# Patient Record
Sex: Female | Born: 1955
Health system: Southern US, Community
[De-identification: ages and names within clinical notes are randomized; demographics above are authoritative.]

## PROBLEM LIST (undated history)

## (undated) DIAGNOSIS — N83209 Unspecified ovarian cyst, unspecified side: Secondary | ICD-10-CM

## (undated) DIAGNOSIS — Z86718 Personal history of other venous thrombosis and embolism: Secondary | ICD-10-CM

## (undated) DIAGNOSIS — N200 Calculus of kidney: Secondary | ICD-10-CM

## (undated) DIAGNOSIS — M25569 Pain in unspecified knee: Secondary | ICD-10-CM

## (undated) DIAGNOSIS — E785 Hyperlipidemia, unspecified: Secondary | ICD-10-CM

## (undated) DIAGNOSIS — Z87442 Personal history of urinary calculi: Secondary | ICD-10-CM

## (undated) DIAGNOSIS — M1990 Unspecified osteoarthritis, unspecified site: Secondary | ICD-10-CM

## (undated) DIAGNOSIS — I2699 Other pulmonary embolism without acute cor pulmonale: Secondary | ICD-10-CM

## (undated) DIAGNOSIS — R112 Nausea with vomiting, unspecified: Secondary | ICD-10-CM

## (undated) DIAGNOSIS — N6091 Unspecified benign mammary dysplasia of right breast: Secondary | ICD-10-CM

## (undated) DIAGNOSIS — K635 Polyp of colon: Secondary | ICD-10-CM

## (undated) DIAGNOSIS — Z9889 Other specified postprocedural states: Secondary | ICD-10-CM

## (undated) DIAGNOSIS — J3089 Other allergic rhinitis: Secondary | ICD-10-CM

## (undated) HISTORY — PX: KIDNEY STONE SURGERY: SHX686

## (undated) HISTORY — DX: Hyperlipidemia, unspecified: E78.5

## (undated) HISTORY — DX: Unspecified benign mammary dysplasia of right breast: N60.91

## (undated) HISTORY — DX: Polyp of colon: K63.5

## (undated) HISTORY — PX: TUBAL LIGATION: SHX77

## (undated) HISTORY — DX: Personal history of other venous thrombosis and embolism: Z86.718

## (undated) HISTORY — PX: EYE SURGERY: SHX253

## (undated) HISTORY — DX: Unspecified ovarian cyst, unspecified side: N83.209

## (undated) HISTORY — DX: Calculus of kidney: N20.0

## (undated) HISTORY — PX: FOOT SURGERY: SHX648

---

## 1992-05-29 HISTORY — PX: LASIK: SHX215

## 1993-05-29 HISTORY — PX: REFRACTIVE SURGERY: SHX103

## 1996-11-26 DIAGNOSIS — Z86718 Personal history of other venous thrombosis and embolism: Secondary | ICD-10-CM

## 1996-11-26 HISTORY — DX: Personal history of other venous thrombosis and embolism: Z86.718

## 1997-05-29 HISTORY — PX: ABDOMINAL HYSTERECTOMY: SHX81

## 2003-02-27 HISTORY — PX: TOTAL ABDOMINAL HYSTERECTOMY W/ BILATERAL SALPINGOOPHORECTOMY: SHX83

## 2012-04-28 HISTORY — PX: COLONOSCOPY: SHX174

## 2012-09-26 DIAGNOSIS — N200 Calculus of kidney: Secondary | ICD-10-CM

## 2012-09-26 HISTORY — DX: Calculus of kidney: N20.0

## 2012-09-26 HISTORY — PX: LITHOTRIPSY: SUR834

## 2012-11-12 ENCOUNTER — Ambulatory Visit: Payer: Self-pay | Admitting: Obstetrics and Gynecology

## 2012-12-03 ENCOUNTER — Ambulatory Visit (INDEPENDENT_AMBULATORY_CARE_PROVIDER_SITE_OTHER): Payer: 59 | Admitting: General Surgery

## 2012-12-03 ENCOUNTER — Encounter: Payer: Self-pay | Admitting: General Surgery

## 2012-12-03 VITALS — BP 132/70 | HR 72 | Resp 12 | Ht 68.0 in | Wt 175.0 lb

## 2012-12-03 DIAGNOSIS — N63 Unspecified lump in unspecified breast: Secondary | ICD-10-CM

## 2012-12-03 NOTE — Patient Instructions (Addendum)
CARE AFTER BREAST BIOPSY  1. Leave the dressing on that your doctor applied after surgery. It is waterproof. You may bathe, shower and/or swim. The dressing will probably remain intact until your return office visit. If the dressing comes off, you will see small strips of tape against your skin on the incision. Do not remove these strips.  2. You may want to use a gauze,cloth or similar protection in your bra to prevent rubbing against your dressing and incision. This is not necessary, but you may feel more comfortable doing so.  3. It is recommended that you wear a bra day and night to give support to the breast. This will prevent the weight of the breast from pulling on the incision.  4. Your breast will feel hard and lumpy under the incision. Do not be alarmed. This is the underlying stitching of tissue. Softening of this tissue will occur in time.  5. Make sure you call the office and schedule an appointment in one week after your surgery. The office phone number is [phone]. The [facility] may have already done this for you.  6. You will notice about a week after your office visit that the strips of the tape on your incision will begin to loosen. These may then be removed.  7. Report to your doctor any of the following:  * Severe pain not relieved by your pain medication  *Redness of the incision  * Drainage from the incision  *Fever greater than 101 degrees  If biopsy normal then follow up in 6 months

## 2012-12-03 NOTE — Progress Notes (Signed)
Patient ID: Virginia Fritz, female   DOB: 1955/12/28, 57 y.o.   MRN: [id number]  Chief Complaint  Patient presents with  . Follow-up    mammogram    HPI Virginia Fritz is a 57 y.o. female here for a follow up mammogram that was abnormall. Screening mammogram done at [facility], then had additional views done at [facility] June 17. The patient does perform routine breast exams and does get mammograms done regularly. Patient does have a family history of breast cancer, sister breast cancer age 41. Patient unable to feel "anything", no breast trauma. HPI  Past Medical History  Diagnosis Date  . Hyperlipidemia   . Hx of blood clots July 1998    PE  . Kidney calculi May 2014    Past Surgical History  Procedure Laterality Date  . Abdominal hysterectomy  05/1997  . Total abdominal hysterectomy w/ bilateral salpingoophorectomy  02/2003  . Cesarean section  1989, 1993  . Lasik  1994  . Refractive surgery  1995  . Foot surgery  1995  . Lithotripsy  May 2014  . Colonoscopy  Dec 2013    Virginia Fritz    Family History  Problem Relation Age of Onset  . Breast cancer Sister   . Diabetes Father     Social History History  Substance Use Topics  . Smoking status: Never Smoker   . Smokeless tobacco: Never Used  . Alcohol Use: Yes    Allergies  Allergen Reactions  . Amoxil (Amoxicillin) Hives  . Ivp Dye (Iodinated Diagnostic Agents)   . Percocet (Oxycodone-Acetaminophen) Nausea And Vomiting    Current Outpatient Prescriptions  Medication Sig Dispense Refill  . aspirin 81 MG tablet Take 81 mg by mouth daily.      Marland Kitchen conjugated estrogens (PREMARIN) vaginal cream Place 1 g vaginally once a week.       No current facility-administered medications for this visit.    Review of Systems Review of Systems  Constitutional: Negative.   Respiratory: Negative.   Cardiovascular: Negative.     Blood pressure 132/70, pulse 72, resp. rate 12, height 5\' 8"  (1.727 m), weight 175 lb (79.379  kg).  Physical Exam Physical Exam  Constitutional: She is oriented to person, place, and time. She appears well-developed and well-nourished.  Cardiovascular: Normal rate and regular rhythm.   Pulmonary/Chest: Effort normal and breath sounds normal. Right breast exhibits no inverted nipple, no mass, no nipple discharge, no skin change and no tenderness. Left breast exhibits no inverted nipple, no mass, no nipple discharge, no skin change and no tenderness.  Lymphadenopathy:    She has no cervical adenopathy.    She has no axillary adenopathy.  Neurological: She is alert and oriented to person, place, and time.  Skin: Skin is warm and dry.    Data Reviewed Screening mammogram dated October 30, 2012 showed scattered fibroglandular density and an increasing nodule the lateral aspect of the right breast. BI-RAD-0. Focal spot compression views and ultrasound dated November 12, 2012 showed persistent nodular density. Ultrasound showed a 0.9 cm irregular hypoechoic nodule without vascularity corresponded to the mammographic abnormality. BI-RAD-4.  Ultrasound examination confirmed a 0.5 x 0.82 x 0.85 cm hypoechoic slightly irregular mass with no clear-cut acoustic enhancement or shadowing at the 9:00 position of the right breast 5 cm from the nipple. The patient was amenable to biopsy.  10 cc of 0.5% Xylocaine with 0.25% Marcaine with one 200,000 units of epinephrine was utilized a well-tolerated. ChloraPrep was applied to  the skin. A 10-gauge Encor device was used and the area in question completely removed. A postbiopsy clip was placed. Skin defect was closed with benzoin and Steri-Strips followed by Telfa and Tegaderm dressing. The procedure was well tolerated.    Assessment    Right breast mass     Plan    The patient will be contacted when the pathology is available. She will follow up with the nurse in one week for a wound check. Assuming benign results a followup mammogram will be obtained in 6  months.        Virginia Fritz 12/04/2012, 7:35 PM

## 2012-12-04 ENCOUNTER — Encounter: Payer: Self-pay | Admitting: General Surgery

## 2012-12-04 DIAGNOSIS — N63 Unspecified lump in unspecified breast: Secondary | ICD-10-CM | POA: Insufficient documentation

## 2012-12-05 LAB — PATHOLOGY

## 2012-12-06 ENCOUNTER — Telehealth: Payer: Self-pay | Admitting: General Surgery

## 2012-12-06 NOTE — Telephone Encounter (Signed)
The patient was notified of the results from her 12/03/2012 vacuum biopsy of the right breast. A single focus of atypical ductal hyperplasia was identified and a large volume of tissue. Review of the previous mammogram and ultrasound was completed. Postbiopsy imaging showed the area completely removed.  The patient was notified that standard recommendation is for wide excision after identification of 80 H. because with 12% incidence of a more significant process. Based on complete removal of the area, a single foci on pathologic review I think that the likelihood of finding DCIS or utilize likely invasive cancers exceptionally small.  I would be very comfortable with obtaining a 6 month followup mammogram and going from that point.  At this time the patient is comfortable with six-month followup. She was advised that should she have concerns were desirous it down discussion prior to January 2015 she should call.  She will follow up next week with a nurse as previously scheduled.

## 2012-12-11 ENCOUNTER — Ambulatory Visit (INDEPENDENT_AMBULATORY_CARE_PROVIDER_SITE_OTHER): Payer: 59 | Admitting: *Deleted

## 2012-12-11 DIAGNOSIS — N63 Unspecified lump in unspecified breast: Secondary | ICD-10-CM

## 2012-12-11 NOTE — Progress Notes (Signed)
Patient here today for follow up post right breast biopsy.  Minimal bruising noted.  The patient is aware that a heating pad may be used for comfort as needed.Patient had alittle redness from the tegaderm on the outer area of the incision.Patient is aware of her path and will follow up as scheduled.

## 2012-12-20 ENCOUNTER — Encounter: Payer: Self-pay | Admitting: General Surgery

## 2013-04-03 ENCOUNTER — Other Ambulatory Visit: Payer: Self-pay

## 2013-06-17 ENCOUNTER — Ambulatory Visit: Payer: Self-pay | Admitting: General Surgery

## 2013-06-18 ENCOUNTER — Encounter: Payer: Self-pay | Admitting: General Surgery

## 2013-06-25 ENCOUNTER — Encounter: Payer: Self-pay | Admitting: General Surgery

## 2013-06-25 ENCOUNTER — Other Ambulatory Visit: Payer: Self-pay | Admitting: General Surgery

## 2013-06-25 ENCOUNTER — Ambulatory Visit (INDEPENDENT_AMBULATORY_CARE_PROVIDER_SITE_OTHER): Payer: 59 | Admitting: General Surgery

## 2013-06-25 ENCOUNTER — Other Ambulatory Visit: Payer: 59

## 2013-06-25 VITALS — BP 130/72 | HR 74 | Resp 12 | Ht 68.0 in | Wt 180.0 lb

## 2013-06-25 DIAGNOSIS — N6089 Other benign mammary dysplasias of unspecified breast: Secondary | ICD-10-CM

## 2013-06-25 DIAGNOSIS — N63 Unspecified lump in unspecified breast: Secondary | ICD-10-CM

## 2013-06-25 DIAGNOSIS — N6091 Unspecified benign mammary dysplasia of right breast: Secondary | ICD-10-CM

## 2013-06-25 NOTE — Patient Instructions (Addendum)
Patient to have an left breast biopsy at [facility] Breast Biopsy A breast biopsy is a procedure where a sample of breast tissue is removed from your breast. The tissue is examined under a microscope to see if cancerous cells are present. A breast biopsy is done when there is:  Any undiagnosed breast mass (tumor).  Nipple abnormalities, dimpling, crusting, or ulcerations.  Abnormal discharge from the nipple, especially blood.  Redness, swelling, and pain of the breast.  Calcium deposits (calcifications) or abnormalities seen on a mammogram, ultrasound result, or results of magnetic resonance imaging (MRI).  Suspicious changes in the breast seen on your mammogram. If the tumor is found to be cancerous (malignant), a breast biopsy can help to determine what the best treatment is for you. There are many different types of breast biopsies. Talk to your caregiver about your options and which type is best for you. LET YOUR CAREGIVER KNOW ABOUT:  Allergies to food or medicine.  Medicines taken, including vitamins, herbs, eyedrops, over-the-counter medicines, and creams.  Use of steroids (by mouth or creams).  Previous problems with anesthetics or numbing medicines.  History of bleeding problems or blood clots.  Previous surgery.  Other health problems, including diabetes and kidney problems.  Any recent colds or infections.  Possibility of pregnancy, if this applies. RISKS AND COMPLICATIONS   Bleeding.  Infection.  Allergy to medicines.  Bruising and swelling of the breast.  Alteration in the shape of the breast.  Not finding the lump or abnormality.  Needing more surgery. BEFORE THE PROCEDURE  Arrange for someone to drive you home after the procedure.  Do not smoke for 2 weeks before the procedure. Stop smoking, if you smoke.  Do not drink alcohol for 24 hours before procedure.  Wear a good support bra to the procedure. PROCEDURE  You may be given a medicine to numb  the breast area (local anesthesia) or a medicine to make you sleep (general anesthesia) during the procedure. The following are the different types of biopsies that can be performed.   Fine-needle aspiration A thin needle is attached to a syringe and inserted into the breast lump. Fluid and cells are removed and then looked at under a microscope. If the breast lump cannot be felt, an ultrasound may be used to help locate the lump and place the needle in the correct area.   Core needle biopsy A wide, hollow needle (core needle) is inserted into the breast lump 3 6 times to get tissue samples or cores. The samples are removed. The needle is usually placed in the correct area by using an ultrasound or X-ray.   Stereotactic biopsy X-ray equipment and a computer are used to analyze X-ray pictures of the breast lump. The computer then finds exactly where the core needle needs to be inserted. Tissue samples are removed.   Vacuum-assisted biopsy A small incision (less than  inch) is made in your breast. A biopsy device that includes a hollow needle and vacuum is passed through the incision and into the breast tissue. The vacuum gently draws abnormal breast tissue into the needle to remove it. This type of biopsy removes a larger tissue sample than a regular core needle biopsy. No stitches are needed, and there is usually little scarring.  Ultrasound-guided core needle biopsy A high frequency ultrasound helps guide the core needle to the area of the mass or abnormality. An incision is made to insert the needle. Tissue samples are removed.  Open biopsy A larger incision  is made in the breast. Your caregiver will attempt to remove the whole breast lump or as much as possible. AFTER THE PROCEDURE  You will be taken to the recovery area. If you are doing well and have no problems, you will be allowed to go home.  You may notice bruising on your breast. This is normal.  Your caregiver may apply a pressure  dressing on your breast for 24 48 hours. A pressure dressing is a bandage that is wrapped tightly around the chest to stop fluid from collecting underneath tissues. Document Released: 05/15/2005 Document Revised: 09/09/2012 Document Reviewed: 06/15/2011 [facility] Patient Information 2014 [place].  Patient's surgery has been scheduled for 07-02-13 at [facility].

## 2013-06-25 NOTE — Progress Notes (Signed)
Patient ID: Virginia Fritz, female   DOB: [birth year], 57 y.o.   MRN: [id number]  Chief Complaint  Patient presents with  . Follow-up    6 month right diagnostic mammogram     HPI Virginia Fritz is a 58 y.o. female who presents for a breast evaluation. The most recent mammogram was done on 06/17/13. Patient does perform regular self breast checks and gets regular mammograms done.  The patient denies any problems with the breasts at this time.    HPI  Past Medical History  Diagnosis Date  . Hyperlipidemia   . Hx of blood clots July 1998    PE  . Kidney calculi May 2014    Past Surgical History  Procedure Laterality Date  . Abdominal hysterectomy  05/1997  . Total abdominal hysterectomy w/ bilateral salpingoophorectomy  02/2003  . Cesarean section  1989, 1993  . Lasik  1994  . Refractive surgery  1995  . Foot surgery  1995  . Lithotripsy  May 2014  . Colonoscopy  Dec 2013    [name]    Family History  Problem Relation Age of Onset  . Breast cancer Sister   . Diabetes Father     Social History History  Substance Use Topics  . Smoking status: Never Smoker   . Smokeless tobacco: Never Used  . Alcohol Use: Yes    Allergies  Allergen Reactions  . Amoxil [Amoxicillin] Hives  . Ivp Dye [Iodinated Diagnostic Agents]   . Percocet [Oxycodone-Acetaminophen] Nausea And Vomiting    Current Outpatient Prescriptions  Medication Sig Dispense Refill  . aspirin 81 MG tablet Take 81 mg by mouth daily.      Marland Kitchen conjugated estrogens (PREMARIN) vaginal cream Place 1 g vaginally once a week.      . desoximetasone (TOPICORT) 0.05 % cream Apply 1 application topically once a week.       No current facility-administered medications for this visit.    Review of Systems Review of Systems  Constitutional: Negative.   Respiratory: Negative.   Cardiovascular: Negative.     Blood pressure 130/72, pulse 74, resp. rate 12, height 5\' 8"  (1.727 m), weight 180 lb (81.647  kg).  Physical Exam Physical Exam  Constitutional: She is oriented to person, place, and time. She appears well-developed and well-nourished.  Neck: Neck supple.  Cardiovascular: Normal rate, regular rhythm and normal heart sounds.   Pulmonary/Chest: Effort normal and breath sounds normal. Right breast exhibits no inverted nipple, no mass, no nipple discharge, no skin change and no tenderness. Left breast exhibits no inverted nipple, no mass, no nipple discharge, no skin change and no tenderness.  Lymphadenopathy:    She has no cervical adenopathy.    She has no axillary adenopathy.  Neurological: She is alert and oriented to person, place, and time.  Skin: Skin is warm.    Data Reviewed Right breast mammogram and ultrasound dated June 17, 2013 was reviewed. Previously placed biopsy clip is evident. Ultrasound examination showed an irregular hypoechoic mass in the right breast at 9:00 position 4 cm from the nipple. A biopsy clip is noted to be centrally located within the lesion. BI-RAD-4.   Ultrasound was completed today to confirm that the site of the previous biopsy could be identified in the operating room. A hypoechoic 1.1 cm area as described by the radiologist Is identified in the right breast at the 9:00 position.  Assessment    Atypical ductal hyperplasia the right breast.     Plan  The presently identified ultrasound findings are likely secondary to the vacuum biopsy completed 6 months ago. At the time of her original biopsy we discussed the 12% incidence of findings or significant ADH, but a planned 6 month followup was to be undertaken. With the recent ultrasound findings and with her husband's encouragement, she is interested in proceeding to excisional biopsy. The procedure was reviewed, and pros and cons of operative biopsy were discussed.    Patient's surgery has been scheduled for 07-02-13 at [facility].   Robert Bellow 06/25/2013, 8:52 PM

## 2013-06-30 DIAGNOSIS — N6091 Unspecified benign mammary dysplasia of right breast: Secondary | ICD-10-CM

## 2013-06-30 HISTORY — DX: Unspecified benign mammary dysplasia of right breast: N60.91

## 2013-07-02 ENCOUNTER — Ambulatory Visit: Payer: Self-pay | Admitting: General Surgery

## 2013-07-02 DIAGNOSIS — N6089 Other benign mammary dysplasias of unspecified breast: Secondary | ICD-10-CM

## 2013-07-02 HISTORY — PX: BREAST SURGERY: SHX581

## 2013-07-03 ENCOUNTER — Encounter: Payer: Self-pay | Admitting: General Surgery

## 2013-07-04 LAB — PATHOLOGY REPORT

## 2013-07-06 ENCOUNTER — Encounter: Payer: Self-pay | Admitting: General Surgery

## 2013-07-15 ENCOUNTER — Ambulatory Visit (INDEPENDENT_AMBULATORY_CARE_PROVIDER_SITE_OTHER): Payer: 59 | Admitting: General Surgery

## 2013-07-15 ENCOUNTER — Encounter: Payer: Self-pay | Admitting: General Surgery

## 2013-07-15 VITALS — BP 110/70 | HR 76 | Resp 14 | Ht 68.0 in | Wt 177.0 lb

## 2013-07-15 DIAGNOSIS — N6089 Other benign mammary dysplasias of unspecified breast: Secondary | ICD-10-CM

## 2013-07-15 DIAGNOSIS — N6091 Unspecified benign mammary dysplasia of right breast: Secondary | ICD-10-CM

## 2013-07-15 NOTE — Progress Notes (Signed)
Patient ID: Virginia Fritz, female   DOB: [birth year], 58 y.o.   MRN: [id number]  Chief Complaint  Patient presents with  . Routine Post Op    right breast biopsy    HPI Virginia Fritz is a 58 y.o. female here today for her post op right breast biopsy done on 07/02/13. Patient states she has a rash near overlying the right breast and chest wall developed 1 week after surgery. This has decreased in intensity since its initial discovery with the use of cream and Benadryl.  The patient is still appreciating some mild heaviness in the breast when it is dependent for    .HPI    Past Medical History  Diagnosis Date  . Hyperlipidemia   . Hx of blood clots July 1998    PE  . Kidney calculi May 2014    Past Surgical History  Procedure Laterality Date  . Abdominal hysterectomy  05/1997  . Total abdominal hysterectomy w/ bilateral salpingoophorectomy  02/2003  . Cesarean section  1989, 1993  . Lasik  1994  . Refractive surgery  1995  . Foot surgery  1995  . Lithotripsy  May 2014  . Colonoscopy  Dec 2013    Virginia Fritz    Family History  Problem Relation Age of Onset  . Breast cancer Sister   . Diabetes Father     Social History History  Substance Use Topics  . Smoking status: Never Smoker   . Smokeless tobacco: Never Used  . Alcohol Use: Yes    Allergies  Allergen Reactions  . Amoxil [Amoxicillin] Hives  . Ivp Dye [Iodinated Diagnostic Agents]   . Percocet [Oxycodone-Acetaminophen] Nausea And Vomiting    Current Outpatient Prescriptions  Medication Sig Dispense Refill  . aspirin 81 MG tablet Take 81 mg by mouth daily.      Marland Kitchen conjugated estrogens (PREMARIN) vaginal cream Place 1 g vaginally once a week.      . desoximetasone (TOPICORT) 0.05 % cream Apply 1 application topically once a week.       No current facility-administered medications for this visit.    Review of Systems Review of Systems  Constitutional: Negative.   Respiratory: Negative.    Cardiovascular: Negative.     Blood pressure 110/70, pulse 76, resp. rate 14, height 5\' 8"  (1.727 m), weight 177 lb (80.287 kg).  Physical Exam Physical Exam  Constitutional: She is oriented to person, place, and time. She appears well-developed and well-nourished.  Eyes: Conjunctivae are normal.  Neck: Neck supple.  Pulmonary/Chest:  Right breast thickening along the scar.   Neurological: She is alert and oriented to person, place, and time.  Skin: Skin is warm and dry.    Data Reviewed 1 mm foci of atypical ductal hyperplasia.  Assessment    Doing well status post reexcision of ADH.    Plan    The patient's case was presented at the [facility] tumor board. No additional therapy was felt indicated.  The patient had declined chemoprevention in the past. In light of her pulmonary embolus in 1990s without clear etiology, this seems prudent.  We'll plan for bilateral mammograms in July 2015 with an office visit to follow.       Virginia Fritz 07/15/2013, 12:06 PM

## 2013-07-15 NOTE — Patient Instructions (Signed)
Patient to return in 5 months bilateral diagnotic mammogram.

## 2013-12-19 ENCOUNTER — Encounter: Payer: Self-pay | Admitting: General Surgery

## 2013-12-25 ENCOUNTER — Ambulatory Visit: Payer: 59 | Admitting: General Surgery

## 2013-12-29 ENCOUNTER — Encounter: Payer: Self-pay | Admitting: General Surgery

## 2013-12-29 ENCOUNTER — Ambulatory Visit (INDEPENDENT_AMBULATORY_CARE_PROVIDER_SITE_OTHER): Payer: 59 | Admitting: General Surgery

## 2013-12-29 VITALS — BP 120/70 | HR 74 | Resp 12 | Ht 68.0 in | Wt 174.0 lb

## 2013-12-29 DIAGNOSIS — N6091 Unspecified benign mammary dysplasia of right breast: Secondary | ICD-10-CM

## 2013-12-29 DIAGNOSIS — N6089 Other benign mammary dysplasias of unspecified breast: Secondary | ICD-10-CM

## 2013-12-29 NOTE — Progress Notes (Signed)
Patient ID: Virginia Fritz, female   DOB: 01-09-1956, 58 y.o.   MRN: [id number]  Chief Complaint  Patient presents with  . Follow-up    mammogram    HPI Virginia Fritz is a 58 y.o. female who presents for a breast evaluation. The most recent mammogram was done on 12/19/13 at [facility].Marland Kitchen  Patient does perform regular self breast checks and gets regular mammograms done.    HPI  Past Medical History  Diagnosis Date  . Hyperlipidemia   . Hx of blood clots July 1998    PE post-operative  . Kidney calculi May 2014  . Atypical ductal hyperplasia of right breast February 2,2015    1 mm foci of ADH without margin involvement    Past Surgical History  Procedure Laterality Date  . Abdominal hysterectomy  05/1997  . Total abdominal hysterectomy w/ bilateral salpingoophorectomy  02/2003  . Cesarean section  1989, 1993  . Lasik  1994  . Refractive surgery  1995  . Foot surgery  1995  . Lithotripsy  May 2014  . Colonoscopy  Dec 2013    Virginia Fritz  . Breast surgery Right 07/02/2013    Foci of ADH on core biopsy, no upstaging on formal excision.    Family History  Problem Relation Age of Onset  . Breast cancer Sister   . Diabetes Father     Social History History  Substance Use Topics  . Smoking status: Never Smoker   . Smokeless tobacco: Never Used  . Alcohol Use: Yes    Allergies  Allergen Reactions  . Amoxil [Amoxicillin] Hives  . Ivp Dye [Iodinated Diagnostic Agents]   . Percocet [Oxycodone-Acetaminophen] Nausea And Vomiting    Current Outpatient Prescriptions  Medication Sig Dispense Refill  . aspirin 81 MG tablet Take 81 mg by mouth daily.      Marland Kitchen conjugated estrogens (PREMARIN) vaginal cream Place 1 g vaginally once a week.      . desoximetasone (TOPICORT) 0.05 % cream Apply 1 application topically once a week.      . Multiple Vitamin (MULTI-VITAMINS) TABS Take 1 tablet by mouth daily.       No current facility-administered medications for this visit.    Review of  Systems Review of Systems  Constitutional: Negative.   Respiratory: Negative.   Cardiovascular: Negative.     Blood pressure 120/70, pulse 74, resp. rate 12, height 5\' 8"  (1.727 m), weight 174 lb (78.926 kg).  Physical Exam Physical Exam  Constitutional: She is oriented to person, place, and time. She appears well-developed and well-nourished.  Eyes: Conjunctivae are normal. No scleral icterus.  Neck: Neck supple.  Cardiovascular: Normal rate, regular rhythm and normal heart sounds.   Pulmonary/Chest: Effort normal and breath sounds normal. Right breast exhibits no inverted nipple, no mass, no nipple discharge, no skin change and no tenderness. Left breast exhibits no inverted nipple, no mass, no nipple discharge, no skin change and no tenderness.  Right breast incision well healed.    Abdominal: Soft. Bowel sounds are normal. There is no tenderness.  Lymphadenopathy:    She has no cervical adenopathy.    She has no axillary adenopathy.  Neurological: She is alert and oriented to person, place, and time.  Skin: Skin is warm and dry.    Data Reviewed Bilateral mammograms dated 12/19/2013 completed at [facility] were reviewed and compared to pre-procedure studies. Postbiopsy changes are noted in the right breast. BI-RAD-2.  Assessment    Atypical ductal hyperplasia, past history pulmonary  embolus.    Plan    Anticipated 50% reduction in breast malignancy is overwhelmed by the increase thrombotic affect of tamoxifen. For that reason observation has been chosen.  We'll make arrangements for bilateral screening mammograms in one year.    PCP: [name], [name] 12/30/2013, 3:03 PM

## 2013-12-29 NOTE — Patient Instructions (Signed)
Patient to return in one year bilateral diagnotic mammogram.

## 2013-12-30 ENCOUNTER — Encounter: Payer: Self-pay | Admitting: General Surgery

## 2014-03-30 ENCOUNTER — Encounter: Payer: Self-pay | Admitting: General Surgery

## 2014-04-15 ENCOUNTER — Ambulatory Visit: Payer: Self-pay | Admitting: Podiatry

## 2014-09-19 NOTE — Op Note (Signed)
PATIENT NAME:  Virginia Fritz, Virginia Fritz MR#:  [id number] DATE OF BIRTH:  1955/11/16  DATE OF PROCEDURE:  07/02/2013  PREOPERATIVE DIAGNOSIS: Abnormal mammogram/ultrasound.   POSTOPERATIVE DIAGNOSIS: Abnormal mammogram/ultrasound.   OPERATIVE PROCEDURE: Wide local excision of right breast.   SURGEON: Robert Bellow, MD   ANESTHESIA: General by LMA under Dr. Myra Gianotti, Marcaine 0.5% with 1:200,000 units epinephrine, 30 mL local infiltration.   ESTIMATED BLOOD LOSS: Minimal.   CLINICAL NOTE: This 59 year old woman had previously undergone an ultrasound-guided biopsy of a nodular density in the breast. There was a microscopic foci of ADH, and planned observation was undertaken. Her most recent mammogram showed a prominent area in the area of the previous biopsy, and re-excision was felt appropriate.   OPERATIVE NOTE: With the patient under adequate general anesthesia, the breast was prepped with ChloraPrep and draped. Marcaine was infiltrated for postoperative analgesia. An ultrasound was used to identify the previous biopsy cavity. A radial incision was made at the 9 o'clock position. In the office, the area appeared to be about 4 cm from the nipple, but in the operating room, the area was approximately 7 cm from the nipple. The skin was incised sharply and hemostasis achieved with electrocautery and 3-0 Vicryl ties. Beginning at approximately 1.5 cm below the skin, a 5 x 5 x 6 cm block of tissue was excised and orientated. Specimen radiograph showed the previously placed clip within the center of the specimen, confirmed by the attending pathologist, Bryan Lemma, MD. The deep adipose tissue was approximated with interrupted 2-0 Vicryl figure-of-eight sutures. A similar suture was used for the superficial adipose tissue. Marcaine was infiltrated in the cavity for postoperative analgesia. Benzoin and Steri-Strips followed by fluff gauze, Kerlix, and an Ace wrap was applied.   The patient tolerated the procedure  well and was taken to the recovery room in stable condition.   ____________________________ Robert Bellow, MD jwb:jcm D: 07/02/2013 18:01:54 ET T: 07/02/2013 20:03:39 ET JOB#: [id number]  cc: Robert Bellow, MD, <Dictator> Marcie Bal [name], MD Kanon Novosel Amedeo Kinsman MD ELECTRONICALLY SIGNED 07/09/2013 10:30

## 2014-09-21 LAB — SURGICAL PATHOLOGY

## 2014-12-18 ENCOUNTER — Encounter: Payer: Self-pay | Admitting: General Surgery

## 2014-12-28 ENCOUNTER — Encounter: Payer: Self-pay | Admitting: General Surgery

## 2014-12-28 ENCOUNTER — Ambulatory Visit (INDEPENDENT_AMBULATORY_CARE_PROVIDER_SITE_OTHER): Payer: 59 | Admitting: General Surgery

## 2014-12-28 VITALS — BP 124/70 | HR 74 | Resp 12 | Ht 68.0 in | Wt 181.0 lb

## 2014-12-28 DIAGNOSIS — Z1239 Encounter for other screening for malignant neoplasm of breast: Secondary | ICD-10-CM

## 2014-12-28 DIAGNOSIS — N62 Hypertrophy of breast: Secondary | ICD-10-CM | POA: Diagnosis not present

## 2014-12-28 DIAGNOSIS — N6091 Unspecified benign mammary dysplasia of right breast: Secondary | ICD-10-CM

## 2014-12-28 NOTE — Patient Instructions (Signed)
Patient will be asked to return to the office in one year with a bilateral screening mammogram. 

## 2014-12-28 NOTE — Progress Notes (Signed)
Patient ID: Virginia Fritz, female   DOB: 11-25-1955, 60 y.o.   MRN: [id number]  Chief Complaint  Patient presents with  . Follow-up    mammogram    HPI Virginia Fritz is a 59 y.o. female who presents for a breast evaluation. The most recent mammogram was done on 12/17/14. Patient does perform regular self breast checks and gets regular mammograms done.     HPI  Past Medical History  Diagnosis Date  . Hyperlipidemia   . Hx of blood clots July 1998    PE post-operative  . Kidney calculi May 2014  . Atypical ductal hyperplasia of right breast February 2,2015    1 mm foci of ADH without margin involvement    Past Surgical History  Procedure Laterality Date  . Abdominal hysterectomy  05/1997  . Total abdominal hysterectomy w/ bilateral salpingoophorectomy  02/2003  . Cesarean section  1989, 1993  . Lasik  1994  . Refractive surgery  1995  . Foot surgery  [id number]  . Lithotripsy  May 2014  . Colonoscopy  Dec 2013    Virginia Fritz  . Breast surgery Right 07/02/2013    Foci of ADH on core biopsy, no upstaging on formal excision.    Family History  Problem Relation Age of Onset  . Breast cancer Sister   . Diabetes Father     Social History History  Substance Use Topics  . Smoking status: Never Smoker   . Smokeless tobacco: Never Used  . Alcohol Use: Yes    Allergies  Allergen Reactions  . Amoxil [Amoxicillin] Hives  . Ivp Dye [Iodinated Diagnostic Agents]   . Percocet [Oxycodone-Acetaminophen] Nausea And Vomiting    Current Outpatient Prescriptions  Medication Sig Dispense Refill  . aspirin 81 MG tablet Take 81 mg by mouth daily.    Marland Kitchen conjugated estrogens (PREMARIN) vaginal cream Place 1 g vaginally once a week.    . desoximetasone (TOPICORT) 0.05 % cream Apply 1 application topically once a week.    . Multiple Vitamin (MULTI-VITAMINS) TABS Take 1 tablet by mouth daily.     No current facility-administered medications for this visit.    Review of  Systems Review of Systems  Blood pressure 124/70, pulse 74, resp. rate 12, height 5\' 8"  (1.727 m), weight 181 lb (82.101 kg).  Physical Exam Physical Exam  Constitutional: She is oriented to person, place, and time. She appears well-developed and well-nourished.  Eyes: Conjunctivae are normal. No scleral icterus.  Neck: Neck supple.  Cardiovascular: Normal rate, regular rhythm and normal heart sounds.   Pulmonary/Chest: Effort normal and breath sounds normal. Right breast exhibits no inverted nipple, no mass, no nipple discharge, no skin change and no tenderness. Left breast exhibits no inverted nipple, no mass, no nipple discharge, no skin change and no tenderness.    Right breast scar well healed at 9 o'clock  Lymphadenopathy:    She has no cervical adenopathy.    She has no axillary adenopathy.  Neurological: She is alert and oriented to person, place, and time.  Skin: Skin is warm and dry.    Data Reviewed Bilateral screening mammograms dated 12/17/2014 showed postsurgical changes. No other abnormalities. BI-RADS-2.  Assessment    Stable breast exam. No evidence of mammographic abnormality.    Plan    The patient will be asked to return in one year for final follow-up. Her case was presented at the [facility] tumor board in 2015 and adjuvant therapy/chemoprevention was not felt indicated. Considering her past  history of pulmonary embolus was felt to be very reasonable.     Patient will be asked to return to the office in one year with a bilateral screening mammogram. PCP: Copland, Marily Lente 12/28/2014, 9:51 PM

## 2015-12-28 ENCOUNTER — Encounter: Payer: Self-pay | Admitting: General Surgery

## 2016-01-13 ENCOUNTER — Encounter: Payer: Self-pay | Admitting: General Surgery

## 2016-01-13 ENCOUNTER — Ambulatory Visit (INDEPENDENT_AMBULATORY_CARE_PROVIDER_SITE_OTHER): Payer: 59 | Admitting: General Surgery

## 2016-01-13 VITALS — BP 110/60 | HR 76 | Resp 12 | Ht 68.0 in | Wt 179.0 lb

## 2016-01-13 DIAGNOSIS — N6091 Unspecified benign mammary dysplasia of right breast: Secondary | ICD-10-CM

## 2016-01-13 DIAGNOSIS — N62 Hypertrophy of breast: Secondary | ICD-10-CM

## 2016-01-13 NOTE — Progress Notes (Addendum)
Patient ID: Virginia Fritz, female   DOB: 17-Jun-1955, 60 y.o.   MRN: [id number]  Chief Complaint  Patient presents with  . Follow-up    mammogram    HPI Virginia Fritz is a 60 y.o. female.  who presents for a breast evaluation. The most recent mammogram was done on 12-28-15.  Patient does perform regular self breast checks and gets regular mammograms done.     HPI  Past Medical History:  Diagnosis Date  . Atypical ductal hyperplasia of right breast February 2,2015   1 mm foci of ADH without margin involvement  . Hx of blood clots 11/1996   PE post-operative  . Hyperlipidemia   . Kidney calculi May 2014    Past Surgical History:  Procedure Laterality Date  . ABDOMINAL HYSTERECTOMY  05/1997  . BREAST SURGERY Right 07/02/2013   Foci of ADH on core biopsy, no upstaging on formal excision.  Marland [place]  . COLONOSCOPY  Dec 2013   [name]  . FOOT SURGERY  [phone]  . LASIK  1994  . LITHOTRIPSY  May 2014  . [place]  . TOTAL ABDOMINAL HYSTERECTOMY W/ BILATERAL SALPINGOOPHORECTOMY  02/2003  . TUBAL LIGATION      Family History  Problem Relation Age of Onset  . Diabetes Father   . Breast cancer Sister   . Breast cancer Paternal Aunt     Social History Social History  Substance Use Topics  . Smoking status: Never Smoker  . Smokeless tobacco: Never Used  . Alcohol use Yes    Allergies  Allergen Reactions  . Amoxil [Amoxicillin] Hives  . Ivp Dye [Iodinated Diagnostic Agents]   . Percocet [Oxycodone-Acetaminophen] Nausea And Vomiting    Current Outpatient Prescriptions  Medication Sig Dispense Refill  . aspirin 81 MG tablet Take 81 mg by mouth daily.    Marland Kitchen conjugated estrogens (PREMARIN) vaginal cream Place 1 g vaginally once a week.    . desoximetasone (TOPICORT) 0.05 % cream Apply 1 application topically once a week.    . Multiple Vitamin (MULTI-VITAMINS) TABS Take 1 tablet by mouth daily.     No current  facility-administered medications for this visit.     Review of Systems Review of Systems  Constitutional: Negative.   Respiratory: Negative.   Cardiovascular: Negative.     Blood pressure 110/60, pulse 76, resp. rate 12, height 5\' 8"  (1.727 m), weight 179 lb (81.2 kg).  Physical Exam Physical Exam  Constitutional: She is oriented to person, place, and time. She appears well-developed and well-nourished.  Eyes: Conjunctivae are normal. No scleral icterus.  Neck: Neck supple.  Cardiovascular: Normal rate and regular rhythm.   Pulmonary/Chest: Effort normal and breath sounds normal. Right breast exhibits no inverted nipple, no mass, no nipple discharge, no skin change and no tenderness. Left breast exhibits no inverted nipple, no mass, no nipple discharge, no skin change and no tenderness.    Abdominal: Soft. Bowel sounds are normal. There is no tenderness.  Lymphadenopathy:    She has no cervical adenopathy.    She has no axillary adenopathy.  Neurological: She is alert and oriented to person, place, and time.  Skin: Skin is warm and dry.    Data Reviewed December 28, 2015 [facility] BI mammograms reviewed: No interval change. BIRAD 1.   Assessment    Benign breast exam and mammograms.   Chemoprevention previously determined not appropriate with her history of PE in 1990's (likely related to BCP started  3 weeks before event).     Plan    Annual screening mammograms with her PCP/ GYN.     Patient to return as needed.  This information has been scribed by Karie Fetch RN, BSN,BC.  Robert Bellow 01/14/2016, 8:53 PM

## 2016-01-13 NOTE — Patient Instructions (Addendum)
The patient is aware to call back for any questions or concerns. Patient to return as needed. 

## 2016-09-28 ENCOUNTER — Ambulatory Visit
Admission: EM | Admit: 2016-09-28 | Discharge: 2016-09-28 | Disposition: A | Discharge status: Home / Self Care | Payer: 59 | Attending: Family Medicine | Admitting: Family Medicine

## 2016-09-28 DIAGNOSIS — J019 Acute sinusitis, unspecified: Secondary | ICD-10-CM

## 2016-09-28 DIAGNOSIS — J029 Acute pharyngitis, unspecified: Secondary | ICD-10-CM | POA: Diagnosis not present

## 2016-09-28 LAB — RAPID STREP SCREEN (MED CTR MEBANE ONLY): STREPTOCOCCUS, GROUP A SCREEN (DIRECT): NEGATIVE

## 2016-09-28 MED ORDER — CEFUROXIME AXETIL 500 MG PO TABS: 500.0000 mg | ORAL_TABLET | Freq: Two times a day (BID) | ORAL | 0 refills | Status: DC

## 2016-09-28 MED ORDER — FEXOFENADINE-PSEUDOEPHED ER 180-240 MG PO TB24: 1.0000 | ORAL_TABLET | Freq: Every day | ORAL | 0 refills | Status: DC

## 2016-09-28 MED ORDER — BENZONATATE 200 MG PO CAPS: 200.0000 mg | ORAL_CAPSULE | Freq: Three times a day (TID) | ORAL | 0 refills | Status: DC | PRN

## 2016-09-28 NOTE — ED Triage Notes (Signed)
Pt c/o cough for about a week with a runny nose and now a sore throat.

## 2016-09-28 NOTE — ED Provider Notes (Signed)
[facility]    CSN: [id number] Arrival date & time: 09/28/16  0849     History   Chief Complaint Chief Complaint  Patient presents with  . Cough    HPI Virginia Fritz is a 61 y.o. female.   Patient is a 61 year old white female history of congestion and sinus pressure and sinus pain. She states everything started about 8 days ago last Thursday. Getting significantly worse. She had a sore throat for a few days that seems be getting somewhat better now but now she is coughing up yellowish-green phlegm as well as the sinus drainage and pressure behind her sinuses. She is allergic to penicillin. Past medical history she had phlebitis 20 years ago and was taking blood thinners until she had her abdominal hysterectomy she takes an aspirin a day now along with hysterectomy she's had breast surgery C-sections colonoscopy foot surgery laser surgery on her eyes and tubal ligation before she had the hysterectomy. Father had diabetes sister and maternal family member with breast cancer. She does not smoke and she is allergic to Percocet and amoxicillin and IV dye   The history is provided by the patient. No language interpreter was used.  Cough  Cough characteristics:  Productive Sputum characteristics:  Yellow and green Severity:  Moderate Onset quality:  Sudden Timing:  Constant Progression:  Worsening Chronicity:  New Smoker: no   Context: animal exposure, sick contacts and upper respiratory infection   Context: not occupational exposure   Relieved by:  Cough suppressants Worsened by:  Activity Ineffective treatments:  Rest and cough suppressants (anti histamines) Associated symptoms: sinus congestion and sore throat     Past Medical History:  Diagnosis Date  . Atypical ductal hyperplasia of right breast February 2,2015   1 mm foci of ADH without margin involvement  . Hx of blood clots 11/1996   PE post-operative  . Hyperlipidemia   . Kidney calculi May 2014     Patient Active Problem List   Diagnosis Date Noted  . Atypical ductal hyperplasia of right breast 06/25/2013    Past Surgical History:  Procedure Laterality Date  . ABDOMINAL HYSTERECTOMY  05/1997  . BREAST SURGERY Right 07/02/2013   Foci of ADH on core biopsy, no upstaging on formal excision.  Marland Kitchen [place]  . COLONOSCOPY  Dec 2013   [name]  . FOOT SURGERY  [phone]  . LASIK  1994  . LITHOTRIPSY  May 2014  . [place]  . TOTAL ABDOMINAL HYSTERECTOMY W/ BILATERAL SALPINGOOPHORECTOMY  02/2003  . TUBAL LIGATION      OB History    Gravida Para Term Preterm AB Living   2 2       2    SAB TAB Ectopic Multiple Live Births                  Obstetric Comments   Menstrual age: 89  Age 1st Pregnancy: 65       Home Medications    Prior to Admission medications   Medication Sig Start Date End Date Taking? Authorizing Provider  aspirin 81 MG tablet Take 81 mg by mouth daily.   Yes Historical Provider, MD  conjugated estrogens (PREMARIN) vaginal cream Place 1 g vaginally once a week.   Yes Historical Provider, MD  desoximetasone (TOPICORT) 0.05 % cream Apply 1 application topically once a week. 04/16/13  Yes Historical Provider, MD  Multiple Vitamin (MULTI-VITAMINS) TABS Take 1 tablet by mouth daily.  Yes Historical Provider, MD  benzonatate (TESSALON) 200 MG capsule Take 1 capsule (200 mg total) by mouth 3 (three) times daily as needed. 09/28/16   Frederich Cha, MD  cefUROXime (CEFTIN) 500 MG tablet Take 1 tablet (500 mg total) by mouth 2 (two) times daily. 09/28/16   Frederich Cha, MD  fexofenadine-pseudoephedrine (ALLEGRA-D ALLERGY & CONGESTION) 180-240 MG 24 hr tablet Take 1 tablet by mouth daily. 09/28/16   Frederich Cha, MD    Family History Family History  Problem Relation Age of Onset  . Diabetes Father   . Breast cancer Sister   . Breast cancer Paternal Aunt     Social History Social History  Substance Use Topics  . Smoking status:  Never Smoker  . Smokeless tobacco: Never Used  . Alcohol use Yes     Allergies   Amoxil [amoxicillin]; Ivp dye [iodinated diagnostic agents]; and Percocet [oxycodone-acetaminophen]   Review of Systems Review of Systems  HENT: Positive for sore throat.   Respiratory: Positive for cough.   All other systems reviewed and are negative.    Physical Exam Triage Vital Signs ED Triage Vitals  Enc Vitals Group     BP 09/28/16 0910 125/71     Pulse Rate 09/28/16 0910 95     Resp 09/28/16 0910 18     Temp 09/28/16 0910 98.3 F (36.8 C)     Temp Source 09/28/16 0910 Oral     SpO2 09/28/16 0910 99 %     Weight 09/28/16 0910 170 lb (77.1 kg)     Height 09/28/16 0910 5\' 8"  (1.727 m)     Head Circumference --      Peak Flow --      Pain Score 09/28/16 0911 3     Pain Loc --      Pain Edu? --      Excl. in [place]? --    No data found.   Updated Vital Signs BP 125/71 (BP Location: Left Arm)   Pulse 95   Temp 98.3 F (36.8 C) (Oral)   Resp 18   Ht 5\' 8"  (1.727 m)   Wt 170 lb (77.1 kg)   SpO2 99%   BMI 25.85 kg/m   Visual Acuity Right Eye Distance:   Left Eye Distance:   Bilateral Distance:    Right Eye Near:   Left Eye Near:    Bilateral Near:     Physical Exam  Constitutional: She is oriented to person, place, and time. Vital signs are normal. She appears well-developed and well-nourished.  HENT:  Head: Normocephalic and atraumatic.  Right Ear: Hearing, tympanic membrane, external ear and ear canal normal.  Left Ear: Hearing, tympanic membrane, external ear and ear canal normal.  Nose: Mucosal edema and rhinorrhea present. Right sinus exhibits no maxillary sinus tenderness and no frontal sinus tenderness. Left sinus exhibits no maxillary sinus tenderness and no frontal sinus tenderness.  Mouth/Throat: Uvula is midline. No oral lesions. No dental abscesses, uvula swelling or dental caries. Posterior oropharyngeal erythema present.  Eyes: Conjunctivae are normal. Pupils  are equal, round, and reactive to light.  Neck: Normal range of motion. Neck supple.  Cardiovascular: Normal rate, regular rhythm and normal heart sounds.   Pulmonary/Chest: Effort normal.  Musculoskeletal: Normal range of motion.  Lymphadenopathy:    She has cervical adenopathy.  Neurological: She is alert and oriented to person, place, and time.  Skin: Skin is warm.  Psychiatric: She has a normal mood and affect.  Vitals reviewed.  UC Treatments / Results  Labs (all labs ordered are listed, but only abnormal results are displayed) Labs Reviewed  RAPID STREP SCREEN (NOT AT [facility])  CULTURE, GROUP A STREP [facility])    EKG  EKG Interpretation None       Radiology No results found.  Procedures Procedures (including critical care time)  Medications Ordered in UC Medications - No data to display  Results for orders placed or performed during the hospital encounter of 09/28/16  Rapid strep screen  Result Value Ref Range   Streptococcus, Group A Screen (Direct) NEGATIVE NEGATIVE   Initial Impression / Assessment and Plan / UC Course  I have reviewed the triage vital signs and the nursing notes.  Pertinent labs & imaging results that were available during my care of the patient were reviewed by me and considered in my medical decision making (see chart for details).     We'll treat patient for sinusitis with Ceftin 500 mg twice a day and Tessalon Perles for cough and Allegra-D 24 hours 1 tablet daily. Follow-up PCP if not better in 2 weeks. She declined work note  Final Clinical Impressions(s) / UC Diagnoses   Final diagnoses:  Acute sinusitis, recurrence not specified, unspecified location  Pharyngitis, unspecified etiology    New Prescriptions Discharge Medication List as of 09/28/2016 10:02 AM    START taking these medications   Details  benzonatate (TESSALON) 200 MG capsule Take 1 capsule (200 mg total) by mouth 3 (three) times daily as needed., Starting Thu  09/28/2016, Normal    cefUROXime (CEFTIN) 500 MG tablet Take 1 tablet (500 mg total) by mouth 2 (two) times daily., Starting Thu 09/28/2016, Normal    fexofenadine-pseudoephedrine (ALLEGRA-D ALLERGY & CONGESTION) 180-240 MG 24 hr tablet Take 1 tablet by mouth daily., Starting Thu 09/28/2016, Normal         Frederich Cha, MD 09/28/16 1040

## 2016-10-01 LAB — CULTURE, GROUP A STREP (THRC)

## 2016-12-29 ENCOUNTER — Encounter: Payer: Self-pay | Admitting: Obstetrics and Gynecology

## 2017-01-08 ENCOUNTER — Encounter: Payer: Self-pay | Admitting: Obstetrics and Gynecology

## 2017-02-27 ENCOUNTER — Encounter: Payer: Self-pay | Admitting: Obstetrics and Gynecology

## 2017-02-27 ENCOUNTER — Ambulatory Visit (INDEPENDENT_AMBULATORY_CARE_PROVIDER_SITE_OTHER): Payer: 59 | Admitting: Obstetrics and Gynecology

## 2017-02-27 VITALS — BP 130/80 | HR 79 | Ht 68.0 in | Wt 178.0 lb

## 2017-02-27 DIAGNOSIS — Z0000 Encounter for general adult medical examination without abnormal findings: Secondary | ICD-10-CM | POA: Diagnosis not present

## 2017-02-27 DIAGNOSIS — Z13220 Encounter for screening for lipoid disorders: Secondary | ICD-10-CM | POA: Diagnosis not present

## 2017-02-27 DIAGNOSIS — Z124 Encounter for screening for malignant neoplasm of cervix: Secondary | ICD-10-CM

## 2017-02-27 DIAGNOSIS — Z1151 Encounter for screening for human papillomavirus (HPV): Secondary | ICD-10-CM | POA: Diagnosis not present

## 2017-02-27 DIAGNOSIS — Z01419 Encounter for gynecological examination (general) (routine) without abnormal findings: Secondary | ICD-10-CM

## 2017-02-27 DIAGNOSIS — Z803 Family history of malignant neoplasm of breast: Secondary | ICD-10-CM

## 2017-02-27 DIAGNOSIS — N951 Menopausal and female climacteric states: Secondary | ICD-10-CM | POA: Diagnosis not present

## 2017-02-27 DIAGNOSIS — Z1239 Encounter for other screening for malignant neoplasm of breast: Secondary | ICD-10-CM

## 2017-02-27 DIAGNOSIS — Z1231 Encounter for screening mammogram for malignant neoplasm of breast: Secondary | ICD-10-CM

## 2017-02-27 MED ORDER — ESTRADIOL 0.1 MG/GM VA CREA: TOPICAL_CREAM | VAGINAL | 1 refills | Status: DC

## 2017-02-27 NOTE — Progress Notes (Signed)
PCP: Chad Cordial, PA-C   Chief Complaint  Patient presents with  . Gynecologic Exam    HPI:      Ms. ILLYRIA Fritz is a 60 y.o. G2P2 who LMP was No LMP recorded. Patient has had a hysterectomy., presents today for her annual examination.  Her menses are absent due to hyst. S/P TAH 1999 due to DUB, s/p BSO a few yrs later due to ovar cysts. She does not have intermenstrual bleeding.  She does have vasomotor sx. She is s/p PE on OCPs so can't have ERT.  Sex activity: single partner, contraception - post menopausal status. She does have vaginal dryness. She uses estrace crm with some relief. Plans to try coconut oil for lubricant as well.   Last Pap: April 29, 2014  Results were: no abnormalities /neg HPV DNA.  Hx of STDs: none  Last mammogram: December 29, 2016  Results were: normal--routine follow-up in 12 months There is a FH of breast cancer in her sister and pat aunt, genetic testing not done and declined by pt in past. There is no FH of ovarian cancer. The patient does do self-breast exams. She has a hx of atypical ductal hyperplasia RT breast 2015, followed by Dr. Bary Castilla for awhile.  Colonoscopy: colonoscopy 5 years ago without abnormalities. . Repeat due after 10 years.    Tobacco use: The patient denies current or previous tobacco use. Alcohol use: none Exercise: moderately active  She does get adequate calcium and Vitamin D in her diet.  Borderline lipids and Vit D deficiency 9/17.    Past Medical History:  Diagnosis Date  . Atypical ductal hyperplasia of right breast February 2,2015   1 mm foci of ADH without margin involvement  . Colon polyps   . Hx of blood clots 11/1996   PE post-operative on OCPs  . Hyperlipidemia   . Hyperlipidemia   . Kidney calculi May 2014  . Ovarian cyst     Past Surgical History:  Procedure Laterality Date  . ABDOMINAL HYSTERECTOMY  05/1997  . BREAST SURGERY Right 07/02/2013   Foci of ADH on core biopsy, no upstaging on  formal excision.  [facility]  . COLONOSCOPY  Dec 2013   [name]  . FOOT SURGERY  [phone]  . LASIK  1994  . LITHOTRIPSY  May 2014  . [employer]  . TOTAL ABDOMINAL HYSTERECTOMY W/ BILATERAL SALPINGOOPHORECTOMY  02/2003  . TUBAL LIGATION      Family History  Problem Relation Age of Onset  . Diabetes Father   . Hypertension Father   . Hyperlipidemia Father   . Breast cancer Sister 18  . Melanoma Sister   . Breast cancer Paternal Aunt 57    Social History   Social History  . Marital status: Married    Spouse name: N/A  . Number of children: N/A  . Years of education: N/A   Occupational History  . Not on file.   Social History Main Topics  . Smoking status: Never Smoker  . Smokeless tobacco: Never Used  . Alcohol use Yes  . Drug use: No  . Sexual activity: Yes   Other Topics Concern  . Not on file   Social History Narrative  . No narrative on file    No outpatient prescriptions have been marked as taking for the 02/27/17 encounter (Office Visit) with Kandy Towery, Deirdre Evener, PA-C.      ROS:  Review of Systems  Constitutional:  Negative for fatigue, fever and unexpected weight change.  Respiratory: Negative for cough, shortness of breath and wheezing.   Cardiovascular: Negative for chest pain, palpitations and leg swelling.  Gastrointestinal: Positive for constipation. Negative for blood in stool, diarrhea, nausea and vomiting.  Endocrine: Negative for cold intolerance, heat intolerance and polyuria.  Genitourinary: Positive for frequency. Negative for dyspareunia, dysuria, flank pain, genital sores, hematuria, menstrual problem, pelvic pain, urgency, vaginal bleeding, vaginal discharge and vaginal pain.  Musculoskeletal: Negative for back pain, joint swelling and myalgias.  Skin: Negative for rash.  Neurological: Negative for dizziness, syncope, light-headedness, numbness and headaches.  Hematological: Negative for adenopathy.    Psychiatric/Behavioral: Negative for agitation, confusion, sleep disturbance and suicidal ideas. The patient is not nervous/anxious.      Objective: BP 130/80   Pulse 79   Ht 5\' 8"  (1.727 m)   Wt 178 lb (80.7 kg)   BMI 27.06 kg/m    Physical Exam  Constitutional: She is oriented to person, place, and time. She appears well-developed and well-nourished.  Genitourinary: Vagina normal. There is no rash or tenderness on the right labia. There is no rash or tenderness on the left labia. No erythema or tenderness in the vagina. No vaginal discharge found. Right adnexum does not display mass and does not display tenderness. Left adnexum does not display mass and does not display tenderness.  Genitourinary Comments: UTERUS/CX SURG REM  Neck: Normal range of motion. No thyromegaly present.  Cardiovascular: Normal rate, regular rhythm and normal heart sounds.   No murmur heard. Pulmonary/Chest: Effort normal and breath sounds normal. Right breast exhibits no mass, no nipple discharge, no skin change and no tenderness. Left breast exhibits no mass, no nipple discharge, no skin change and no tenderness.  Abdominal: Soft. There is no tenderness. There is no guarding.  Musculoskeletal: Normal range of motion.  Neurological: She is alert and oriented to person, place, and time. No cranial nerve deficit.  Psychiatric: She has a normal mood and affect. Her behavior is normal.  Vitals reviewed.   Assessment/Plan:  Encounter for annual routine gynecological examination  Cervical cancer screening - Plan: IGP, Aptima HPV  Screening for HPV (human papillomavirus) - Plan: IGP, Aptima HPV  Screening for breast cancer - Pt is current on mammo. - Plan: MM DIGITAL SCREENING BILATERAL  Family history of breast cancer - MyRisk testing discussed and pt declines. F/u prn.   Blood tests for routine general physical examination - Plan: Comprehensive metabolic panel, Lipid panel, Comprehensive metabolic  panel, Lipid panel  Screening cholesterol level - Borderline lipids last yr but pt walking 4 times wkly now with wt loss from last yr. - Plan: Lipid panel, Lipid panel  Vaginal dryness, menopausal - Rx RF estrace crm. - Plan: estradiol (ESTRACE) 0.1 MG/GM vaginal cream   Meds ordered this encounter  Medications  . estradiol (ESTRACE) 0.1 MG/GM vaginal cream    Sig: Insert 1g once weekly as maintenace    Dispense:  42.5 g    Refill:  1           GYN counsel breast self exam, mammography screening, menopause, adequate intake of calcium and vitamin D, diet and exercise    F/U  Return in about 1 year (around 02/27/2018).  Chisom Aust B. Rivaldo Hineman, PA-C 02/27/2017 4:19 PM

## 2017-03-01 LAB — COMPREHENSIVE METABOLIC PANEL
A/G RATIO: 1.9 (ref 1.2–2.2)
ALBUMIN: 4.7 g/dL (ref 3.5–5.5)
ALT: 64 IU/L — ABNORMAL HIGH (ref 0–32)
AST: 56 IU/L — ABNORMAL HIGH (ref 0–40)
Alkaline Phosphatase: 81 IU/L (ref 39–117)
BUN / CREAT RATIO: 21 (ref 9–23)
BUN: 16 mg/dL (ref 6–24)
Bilirubin Total: 0.4 mg/dL (ref 0.0–1.2)
CALCIUM: 9.6 mg/dL (ref 8.7–10.2)
CO2: 24 mmol/L (ref 20–29)
CREATININE: 0.77 mg/dL (ref 0.57–1.00)
Chloride: 107 mmol/L — ABNORMAL HIGH (ref 96–106)
GFR, EST AFRICAN AMERICAN: 98 mL/min/{1.73_m2} (ref >59)
GFR, EST NON AFRICAN AMERICAN: 85 mL/min/{1.73_m2} (ref >59)
GLOBULIN, TOTAL: 2.5 g/dL (ref 1.5–4.5)
Glucose: 99 mg/dL (ref 65–99)
Potassium: 4.9 mmol/L (ref 3.5–5.2)
SODIUM: 144 mmol/L (ref 134–144)
Total Protein: 7.2 g/dL (ref 6.0–8.5)

## 2017-03-01 LAB — LIPID PANEL W/O CHOL/HDL RATIO
Cholesterol, Total: 215 mg/dL — ABNORMAL HIGH (ref 100–199)
HDL: 45 mg/dL (ref >39)
LDL CALC: 131 mg/dL — AB (ref 0–99)
Triglycerides: 197 mg/dL — ABNORMAL HIGH (ref 0–149)
VLDL Cholesterol Cal: 39 mg/dL (ref 5–40)

## 2017-03-02 LAB — IGP, APTIMA HPV
HPV APTIMA: NEGATIVE
PAP SMEAR COMMENT: 0

## 2018-01-07 ENCOUNTER — Encounter: Payer: Self-pay | Admitting: Obstetrics and Gynecology

## 2018-10-23 ENCOUNTER — Emergency Department
Admission: EM | Admit: 2018-10-23 | Discharge: 2018-10-23 | Disposition: A | Discharge status: Home / Self Care | Payer: Commercial Managed Care - PPO | Attending: Emergency Medicine | Admitting: Emergency Medicine

## 2018-10-23 ENCOUNTER — Emergency Department: Payer: Commercial Managed Care - PPO

## 2018-10-23 ENCOUNTER — Other Ambulatory Visit: Payer: Self-pay

## 2018-10-23 DIAGNOSIS — M7122 Synovial cyst of popliteal space [Baker], left knee: Secondary | ICD-10-CM | POA: Diagnosis not present

## 2018-10-23 DIAGNOSIS — M79605 Pain in left leg: Secondary | ICD-10-CM | POA: Diagnosis present

## 2018-10-23 DIAGNOSIS — R200 Anesthesia of skin: Secondary | ICD-10-CM | POA: Insufficient documentation

## 2018-10-23 DIAGNOSIS — Z7982 Long term (current) use of aspirin: Secondary | ICD-10-CM | POA: Insufficient documentation

## 2018-10-23 DIAGNOSIS — Z79899 Other long term (current) drug therapy: Secondary | ICD-10-CM | POA: Insufficient documentation

## 2018-10-23 HISTORY — DX: Other pulmonary embolism without acute cor pulmonale: I26.99

## 2018-10-23 NOTE — ED Notes (Signed)
Patient reports an episode of numbness behind her left knee.  Patient states she is still feeling some numbness but also tingling at this time.  Patient has movement and sensation intact to left foot.  Patient reports history of blood clots.

## 2018-10-23 NOTE — Discharge Instructions (Signed)
Please use Advil or Tylenol as needed for mild pain.  Please return for increasing pain numbness or swelling in the lower leg.  Please follow-up with Dr. Jefm Bryant or Dr. Youlanda Mighty for the Baker's cyst.  Their office a call in the morning me should be out of see you fairly quickly.

## 2018-10-23 NOTE — ED Provider Notes (Signed)
[facility] Emergency Department Provider Note   ____________________________________________   First MD Initiated Contact with Patient 10/23/18 1815     (approximate)  I have reviewed the triage vital signs and the nursing notes.   HISTORY  Chief Complaint Leg Pain   HPI Virginia Fritz is a 63 y.o. female who reports her knee gave out on her about 2 weeks ago.  She saw Dr. Jefm Bryant for her.  X-rays were negative at that time.  Last night she had a painful lump come up behind the left knee.  Today her leg got a little bit numb as well.  This was below the knee.  Symptoms are now better just the lump is painful.  Moderately painful.         Past Medical History:  Diagnosis Date  . Atypical ductal hyperplasia of right breast February 2,2015   1 mm foci of ADH without margin involvement  . Colon polyps   . Hx of blood clots 11/1996   PE post-operative on OCPs  . Hyperlipidemia   . Hyperlipidemia   . Kidney calculi May 2014  . Ovarian cyst   . PE (pulmonary thromboembolism) [facility]) [id number]    Patient Active Problem List   Diagnosis Date Noted  . Atypical ductal hyperplasia of right breast 06/25/2013    Past Surgical History:  Procedure Laterality Date  . ABDOMINAL HYSTERECTOMY  05/1997  . BREAST SURGERY Right 07/02/2013   Foci of ADH on core biopsy, no upstaging on formal excision.  Marland [facility] [employer]  . COLONOSCOPY  Dec 2013   [name]  . FOOT SURGERY  [phone]  . LASIK  1994  . LITHOTRIPSY  May 2014  . [facility]  . TOTAL ABDOMINAL HYSTERECTOMY W/ BILATERAL SALPINGOOPHORECTOMY  02/2003  . TUBAL LIGATION      Prior to Admission medications   Medication Sig Start Date End Date Taking? Authorizing Provider  aspirin 81 MG tablet Take 81 mg by mouth daily.    [provider]  benzonatate (TESSALON) 200 MG capsule Take 1 capsule (200 mg total) by mouth 3 (three) times daily as needed. 09/28/16   Frederich Cha, MD  cefUROXime (CEFTIN) 500 MG tablet Take 1 tablet (500 mg total) by mouth 2 (two) times daily. 09/28/16   Frederich Cha, MD  desoximetasone (TOPICORT) 0.05 % cream Apply 1 application topically once a week. 04/16/13   [provider]  estradiol (ESTRACE) 0.1 MG/GM vaginal cream Insert 1g once weekly as maintenace 03/07/31   Copland, Alicia B, PA-C  fexofenadine-pseudoephedrine (ALLEGRA-D ALLERGY & CONGESTION) 180-240 MG 24 hr tablet Take 1 tablet by mouth daily. 09/28/16   Frederich Cha, MD  Multiple Vitamin (MULTI-VITAMINS) TABS Take 1 tablet by mouth daily.    [provider]    Allergies Amoxil [amoxicillin]; Ivp dye [iodinated diagnostic agents]; and Percocet [oxycodone-acetaminophen]  Family History  Problem Relation Age of Onset  . Diabetes Father   . Hypertension Father   . Hyperlipidemia Father   . Breast cancer Sister 58  . Melanoma Sister   . Breast cancer Paternal Aunt 71    Social History Social History   Tobacco Use  . Smoking status: Never Smoker  . Smokeless tobacco: Never Used  Substance Use Topics  . Alcohol use: Yes  . Drug use: No    Review of Systems  Constitutional: No fever/chills Eyes: No visual changes. ENT: No sore throat. Cardiovascular: Denies chest pain. Respiratory: Denies shortness  of breath. Gastrointestinal: No abdominal pain.  No nausea, no vomiting.  No diarrhea.  No constipation. Genitourinary: Negative for dysuria. Musculoskeletal: Negative for back pain. Skin: Negative for rash. Neurological: Negative for headaches, focal weakness  ____________________________________________   PHYSICAL EXAM:  VITAL SIGNS: ED Triage Vitals  Enc Vitals Group     BP 10/23/18 1525 139/89     Pulse Rate 10/23/18 1525 89     Resp 10/23/18 1525 17     Temp 10/23/18 1525 98.2 F (36.8 C)     Temp Source 10/23/18 1525 Oral     SpO2 10/23/18 1525 99 %     Weight 10/23/18 1526 178 lb (80.7 kg)     Height 10/23/18 1526 5\' 8"   (1.727 m)     Head Circumference --      Peak Flow --      Pain Score 10/23/18 1525 6     Pain Loc --      Pain Edu? --      Excl. in [place]? --     Constitutional: Alert and oriented. Well appearing and in no acute distress. Eyes: Conjunctivae are normal.  Head: Atraumatic. Nose: No congestion/rhinnorhea. Mouth/Throat: Mucous membranes are moist.  Oropharynx non-erythematous. Neck: No stridor.   Cardiovascular: Normal rate, regular rhythm. Grossly normal heart sounds.  Good peripheral circulation. Respiratory: Normal respiratory effort.  No retractions. Lungs CTAB. Gastrointestinal: Soft and nontender. No distention. No abdominal bruits. No CVA tenderness. Musculoskeletal: No lower extremity tenderness nor edema.  There is a somewhat tender lump behind the left knee.  Consistent with a Baker's cyst. Neurologic:  Normal speech and language. No gross focal neurologic deficits are appreciated. Skin:  Skin is warm, dry and intact. No rash noted.   ____________________________________________   LABS (all labs ordered are listed, but only abnormal results are displayed)  Labs Reviewed - No data to display ____________________________________________  EKG   ____________________________________________  RADIOLOGY  ED MD interpretation: Sound read by radiology shows no DVT there is a Baker's cyst.  Official radiology report(s): US Venous Img Lower Unilateral Left  Result Date: 10/23/2018 CLINICAL DATA:  63 year old female with a history of left lower extremity pain and swelling EXAM: LEFT LOWER EXTREMITY VENOUS DOPPLER ULTRASOUND TECHNIQUE: Gray-scale sonography with graded compression, as well as color Doppler and duplex ultrasound were performed to evaluate the lower extremity deep venous systems from the level of the common femoral vein and including the common femoral, femoral, profunda femoral, popliteal and calf veins including the posterior tibial, peroneal and gastrocnemius  veins when visible. The superficial great saphenous vein was also interrogated. Spectral Doppler was utilized to evaluate flow at rest and with distal augmentation maneuvers in the common femoral, femoral and popliteal veins. COMPARISON:  None. FINDINGS: Contralateral Common Femoral Vein: Respiratory phasicity is normal and symmetric with the symptomatic side. No evidence of thrombus. Normal compressibility. Common Femoral Vein: No evidence of thrombus. Normal compressibility, respiratory phasicity and response to augmentation. Saphenofemoral Junction: No evidence of thrombus. Normal compressibility and flow on color Doppler imaging. Profunda Femoral Vein: No evidence of thrombus. Normal compressibility and flow on color Doppler imaging. Femoral Vein: No evidence of thrombus. Normal compressibility, respiratory phasicity and response to augmentation. Popliteal Vein: No evidence of thrombus. Normal compressibility, respiratory phasicity and response to augmentation. Calf Veins: No evidence of thrombus. Normal compressibility and flow on color Doppler imaging. Superficial Great Saphenous Vein: No evidence of thrombus. Normal compressibility and flow on color Doppler imaging. Other Findings: Fluid collection with internal  echoes/complexity within the popliteal region measures 7.0 cm x 2.8 cm x 4.3 cm. IMPRESSION: Sonographic survey of the left lower extremity negative for DVT. Complex left Baker's cyst Electronically Signed   By: Corrie Mckusick D.O.   On: 10/23/2018 16:21    ____________________________________________   PROCEDURES  Procedure(s) performed (including Critical Care):  Procedures   ____________________________________________   INITIAL IMPRESSION / ASSESSMENT AND PLAN / ED COURSE  We will have the patient follow-up with Dr. Jefm Bryant or    Dr. Rudene Christians.  She will return for worsening symptoms or swelling.         ____________________________________________   FINAL CLINICAL  IMPRESSION(S) / ED DIAGNOSES  Final diagnoses:  Left leg pain  Baker's cyst of knee, left     ED Discharge Orders    None       Note:  This document was prepared using Dragon voice recognition software and may include unintentional dictation errors.    Nena Polio, MD 10/23/18 [phone]

## 2018-10-23 NOTE — ED Triage Notes (Signed)
Pt c/o tender area/knot behind the left knee since yesterday. Pt has a hx of PE. Denies redness or heat from site.

## 2018-10-31 ENCOUNTER — Other Ambulatory Visit: Payer: Self-pay | Admitting: Physician Assistant

## 2018-10-31 DIAGNOSIS — M25562 Pain in left knee: Secondary | ICD-10-CM

## 2018-11-11 ENCOUNTER — Ambulatory Visit
Admission: RE | Admit: 2018-11-11 | Discharge: 2018-11-11 | Disposition: A | Discharge status: Home / Self Care | Payer: Commercial Managed Care - PPO | Source: Ambulatory Visit | Attending: Physician Assistant | Admitting: Physician Assistant

## 2018-11-11 ENCOUNTER — Other Ambulatory Visit: Payer: Self-pay

## 2018-11-11 DIAGNOSIS — M25562 Pain in left knee: Secondary | ICD-10-CM | POA: Insufficient documentation

## 2019-01-03 ENCOUNTER — Encounter: Payer: Self-pay | Admitting: Obstetrics and Gynecology

## 2019-01-06 ENCOUNTER — Other Ambulatory Visit: Payer: Self-pay

## 2019-01-06 ENCOUNTER — Encounter
Admission: RE | Admit: 2019-01-06 | Discharge: 2019-01-06 | Disposition: A | Discharge status: Home / Self Care | Payer: Commercial Managed Care - PPO | Source: Ambulatory Visit | Attending: Orthopedic Surgery | Admitting: Orthopedic Surgery

## 2019-01-06 ENCOUNTER — Encounter: Payer: Self-pay | Admitting: Obstetrics and Gynecology

## 2019-01-06 DIAGNOSIS — Z86711 Personal history of pulmonary embolism: Secondary | ICD-10-CM | POA: Diagnosis not present

## 2019-01-06 DIAGNOSIS — Z20828 Contact with and (suspected) exposure to other viral communicable diseases: Secondary | ICD-10-CM | POA: Diagnosis not present

## 2019-01-06 DIAGNOSIS — Z79899 Other long term (current) drug therapy: Secondary | ICD-10-CM | POA: Diagnosis not present

## 2019-01-06 DIAGNOSIS — Z90710 Acquired absence of both cervix and uterus: Secondary | ICD-10-CM | POA: Diagnosis not present

## 2019-01-06 DIAGNOSIS — Z7901 Long term (current) use of anticoagulants: Secondary | ICD-10-CM | POA: Diagnosis not present

## 2019-01-06 DIAGNOSIS — Z888 Allergy status to other drugs, medicaments and biological substances status: Secondary | ICD-10-CM | POA: Diagnosis not present

## 2019-01-06 DIAGNOSIS — M7122 Synovial cyst of popliteal space [Baker], left knee: Secondary | ICD-10-CM | POA: Diagnosis not present

## 2019-01-06 DIAGNOSIS — X58XXXA Exposure to other specified factors, initial encounter: Secondary | ICD-10-CM | POA: Diagnosis not present

## 2019-01-06 DIAGNOSIS — E7800 Pure hypercholesterolemia, unspecified: Secondary | ICD-10-CM | POA: Diagnosis not present

## 2019-01-06 DIAGNOSIS — Z881 Allergy status to other antibiotic agents status: Secondary | ICD-10-CM | POA: Diagnosis not present

## 2019-01-06 DIAGNOSIS — S83242A Other tear of medial meniscus, current injury, left knee, initial encounter: Secondary | ICD-10-CM | POA: Diagnosis not present

## 2019-01-06 DIAGNOSIS — Z885 Allergy status to narcotic agent status: Secondary | ICD-10-CM | POA: Diagnosis not present

## 2019-01-06 DIAGNOSIS — S83272A Complex tear of lateral meniscus, current injury, left knee, initial encounter: Secondary | ICD-10-CM | POA: Diagnosis not present

## 2019-01-06 HISTORY — DX: Other specified postprocedural states: Z98.890

## 2019-01-06 HISTORY — DX: Pain in unspecified knee: M25.569

## 2019-01-06 HISTORY — DX: Nausea with vomiting, unspecified: R11.2

## 2019-01-06 HISTORY — DX: Unspecified osteoarthritis, unspecified site: M19.90

## 2019-01-06 HISTORY — DX: Other allergic rhinitis: J30.89

## 2019-01-06 LAB — CBC
HCT: 45.1 % (ref 36.0–46.0)
Hemoglobin: 14.9 g/dL (ref 12.0–15.0)
MCH: 29.7 pg (ref 26.0–34.0)
MCHC: 33 g/dL (ref 30.0–36.0)
MCV: 89.8 fL (ref 80.0–100.0)
Platelets: 232 10*3/uL (ref 150–400)
RBC: 5.02 MIL/uL (ref 3.87–5.11)
RDW: 12.8 % (ref 11.5–15.5)
WBC: 7 10*3/uL (ref 4.0–10.5)
nRBC: 0 % (ref 0.0–0.2)

## 2019-01-06 LAB — BASIC METABOLIC PANEL
Anion gap: 9 (ref 5–15)
BUN: 17 mg/dL (ref 8–23)
CO2: 24 mmol/L (ref 22–32)
Calcium: 8.9 mg/dL (ref 8.9–10.3)
Chloride: 109 mmol/L (ref 98–111)
Creatinine, Ser: 0.78 mg/dL (ref 0.44–1.00)
GFR calc Af Amer: >60 mL/min (ref >60)
GFR calc non Af Amer: >60 mL/min (ref >60)
Glucose, Bld: 110 mg/dL — ABNORMAL HIGH (ref 70–99)
Potassium: 3.7 mmol/L (ref 3.5–5.1)
Sodium: 142 mmol/L (ref 135–145)

## 2019-01-06 LAB — SARS CORONAVIRUS 2 (TAT 6-24 HRS): SARS Coronavirus 2: NEGATIVE

## 2019-01-06 NOTE — Patient Instructions (Signed)
Your procedure is scheduled on: 01/09/2019 Thurs Report to Same Day Surgery 2nd floor medical mall [facility] Entrance-take elevator on left to 2nd floor.  Check in with surgery information desk.) To find out your arrival time please call [phone] between 1PM - 3PM on 01/08/2019 Wed  Remember: Instructions that are not followed completely may result in serious medical risk, up to and including death, or upon the discretion of your surgeon and anesthesiologist your surgery may need to be rescheduled.    _x___ 1. Do not eat food after midnight the night before your procedure. You may drink clear liquids up to 2 hours before you are scheduled to arrive at the hospital for your procedure.  Do not drink clear liquids within 2 hours of your scheduled arrival to the hospital.  Clear liquids include  --Water or Apple juice without pulp  --Clear carbohydrate beverage such as ClearFast or Gatorade  --Black Coffee or Clear Tea (No milk, no creamers, do not add anything to                  the coffee or Tea Type 1 and type 2 diabetics should only drink water.   ____Ensure clear carbohydrate drink on the way to the hospital for bariatric patients  ____Ensure clear carbohydrate drink 3 hours before surgery.   No gum chewing or hard candies.     __x__ 2. No Alcohol for 24 hours before or after surgery.   __x__3. No Smoking or e-cigarettes for 24 prior to surgery.  Do not use any chewable tobacco products for at least 6 hour prior to surgery   ____  4. Bring all medications with you on the day of surgery if instructed.    __x__ 5. Notify your doctor if there is any change in your medical condition     (cold, fever, infections).    x___6. On the morning of surgery brush your teeth with toothpaste and water.  You may rinse your mouth with mouth wash if you wish.  Do not swallow any toothpaste or mouthwash.   Do not wear jewelry, make-up, hairpins, clips or nail polish.  Do not wear lotions,  powders, or perfumes. You may wear deodorant.  Do not shave 48 hours prior to surgery. Men may shave face and neck.  Do not bring valuables to the hospital.    [facility] is not responsible for any belongings or valuables.               Contacts, dentures or bridgework may not be worn into surgery.  Leave your suitcase in the car. After surgery it may be brought to your room.  For patients admitted to the hospital, discharge time is determined by your                       treatment team.  _  Patients discharged the day of surgery will not be allowed to drive home.  You will need someone to drive you home and stay with you the night of your procedure.    Please read over the following fact sheets that you were given:   [facility] Preparing for Surgery and or MRSA Information   _x___ Take anti-hypertensive listed below, cardiac, seizure, asthma,     anti-reflux and psychiatric medicines. These include:  1. None  2.  3.  4.  5.  6.  ____Fleets enema or Magnesium Citrate as directed.   _x___ Use CHG Soap or  sage wipes as directed on instruction sheet   ____ Use inhalers on the day of surgery and bring to hospital day of surgery  ____ Stop Metformin and Janumet 2 days prior to surgery.    ____ Take 1/2 of usual insulin dose the night before surgery and none on the morning     surgery.   _x___ Follow recommendations from Cardiologist, Pulmonologist or PCP regarding          stopping Aspirin, Coumadin, Plavix ,Eliquis, Effient, or Pradaxa, and Pletal.  X____Stop Anti-inflammatories such as Advil, Aleve, Ibuprofen, Motrin, Naproxen, Naprosyn, Goodies powders or aspirin products. OK to take Tylenol and                          Celebrex.   _x___ Stop supplements until after surgery.  But may continue Vitamin D, Vitamin B,       and multivitamin.   ____ Bring C-Pap to the hospital.

## 2019-01-09 ENCOUNTER — Encounter: Payer: Self-pay | Admitting: *Deleted

## 2019-01-09 ENCOUNTER — Encounter
Admission: RE | Disposition: A | Discharge status: Home / Self Care | Payer: Self-pay | Source: Home / Self Care | Attending: Orthopedic Surgery

## 2019-01-09 ENCOUNTER — Ambulatory Visit: Payer: Commercial Managed Care - PPO | Admitting: Certified Registered"

## 2019-01-09 ENCOUNTER — Ambulatory Visit
Admission: RE | Admit: 2019-01-09 | Discharge: 2019-01-09 | Disposition: A | Discharge status: Home / Self Care | Payer: Commercial Managed Care - PPO | Attending: Orthopedic Surgery | Admitting: Orthopedic Surgery

## 2019-01-09 ENCOUNTER — Other Ambulatory Visit: Payer: Self-pay

## 2019-01-09 DIAGNOSIS — S83272A Complex tear of lateral meniscus, current injury, left knee, initial encounter: Secondary | ICD-10-CM | POA: Insufficient documentation

## 2019-01-09 DIAGNOSIS — S83242A Other tear of medial meniscus, current injury, left knee, initial encounter: Secondary | ICD-10-CM | POA: Insufficient documentation

## 2019-01-09 DIAGNOSIS — X58XXXA Exposure to other specified factors, initial encounter: Secondary | ICD-10-CM | POA: Insufficient documentation

## 2019-01-09 DIAGNOSIS — Z881 Allergy status to other antibiotic agents status: Secondary | ICD-10-CM | POA: Insufficient documentation

## 2019-01-09 DIAGNOSIS — Z90710 Acquired absence of both cervix and uterus: Secondary | ICD-10-CM | POA: Insufficient documentation

## 2019-01-09 DIAGNOSIS — Z7901 Long term (current) use of anticoagulants: Secondary | ICD-10-CM | POA: Insufficient documentation

## 2019-01-09 DIAGNOSIS — Z79899 Other long term (current) drug therapy: Secondary | ICD-10-CM | POA: Insufficient documentation

## 2019-01-09 DIAGNOSIS — E7800 Pure hypercholesterolemia, unspecified: Secondary | ICD-10-CM | POA: Insufficient documentation

## 2019-01-09 DIAGNOSIS — Z20828 Contact with and (suspected) exposure to other viral communicable diseases: Secondary | ICD-10-CM | POA: Insufficient documentation

## 2019-01-09 DIAGNOSIS — Z885 Allergy status to narcotic agent status: Secondary | ICD-10-CM | POA: Insufficient documentation

## 2019-01-09 DIAGNOSIS — Z86711 Personal history of pulmonary embolism: Secondary | ICD-10-CM | POA: Insufficient documentation

## 2019-01-09 DIAGNOSIS — M7122 Synovial cyst of popliteal space [Baker], left knee: Secondary | ICD-10-CM | POA: Insufficient documentation

## 2019-01-09 DIAGNOSIS — Z888 Allergy status to other drugs, medicaments and biological substances status: Secondary | ICD-10-CM | POA: Insufficient documentation

## 2019-01-09 HISTORY — PX: KNEE ARTHROSCOPY: SHX127

## 2019-01-09 SURGERY — ARTHROSCOPY, KNEE
Anesthesia: General | Laterality: Left

## 2019-01-09 MED ORDER — ONDANSETRON HCL 4 MG/2ML IJ SOLN
4.0000 mg | Freq: Once | INTRAMUSCULAR | Status: AC | PRN
  Administered 2019-01-09: 15:00:00 4 mg via INTRAVENOUS

## 2019-01-09 MED ORDER — GLYCOPYRROLATE 0.2 MG/ML IJ SOLN
INTRAMUSCULAR | Status: DC | PRN
  Administered 2019-01-09: 0.2 mg via INTRAVENOUS

## 2019-01-09 MED ORDER — LACTATED RINGERS IV SOLN
INTRAVENOUS | Status: DC
  Administered 2019-01-09: 12:00:00 via INTRAVENOUS

## 2019-01-09 MED ORDER — BUPIVACAINE-EPINEPHRINE (PF) 0.5% -1:200000 IJ SOLN
INTRAMUSCULAR | Status: DC | PRN
  Administered 2019-01-09: 20 mL

## 2019-01-09 MED ORDER — ACETAMINOPHEN 500 MG PO TABS: 500.0000 mg | ORAL_TABLET | Freq: Four times a day (QID) | ORAL | Status: DC

## 2019-01-09 MED ORDER — MORPHINE SULFATE (PF) 4 MG/ML IV SOLN: 0.5000 mg | INTRAVENOUS | Status: DC | PRN

## 2019-01-09 MED ORDER — BUPIVACAINE-EPINEPHRINE (PF) 0.5% -1:200000 IJ SOLN
INTRAMUSCULAR | Status: AC
  Filled 2019-01-09: qty 30

## 2019-01-09 MED ORDER — FENTANYL CITRATE (PF) 100 MCG/2ML IJ SOLN
INTRAMUSCULAR | Status: AC
  Filled 2019-01-09: qty 2

## 2019-01-09 MED ORDER — ACETAMINOPHEN 10 MG/ML IV SOLN
INTRAVENOUS | Status: DC | PRN
  Administered 2019-01-09: 1000 mg via INTRAVENOUS

## 2019-01-09 MED ORDER — HYDROCODONE-ACETAMINOPHEN 5-325 MG PO TABS: 1.0000 | ORAL_TABLET | ORAL | 0 refills | Status: DC | PRN

## 2019-01-09 MED ORDER — ONDANSETRON HCL 4 MG PO TABS: 4.0000 mg | ORAL_TABLET | Freq: Four times a day (QID) | ORAL | Status: DC | PRN

## 2019-01-09 MED ORDER — PROPOFOL 10 MG/ML IV BOLUS
INTRAVENOUS | Status: DC | PRN
  Administered 2019-01-09: 150 mg via INTRAVENOUS

## 2019-01-09 MED ORDER — FAMOTIDINE 20 MG PO TABS
ORAL_TABLET | ORAL | Status: AC
  Administered 2019-01-09: 12:00:00 20 mg via ORAL
  Filled 2019-01-09: qty 1

## 2019-01-09 MED ORDER — LIDOCAINE HCL (CARDIAC) PF 100 MG/5ML IV SOSY
PREFILLED_SYRINGE | INTRAVENOUS | Status: DC | PRN
  Administered 2019-01-09: 100 mg via INTRAVENOUS

## 2019-01-09 MED ORDER — FENTANYL CITRATE (PF) 100 MCG/2ML IJ SOLN
25.0000 ug | INTRAMUSCULAR | Status: DC | PRN
  Administered 2019-01-09 (×2): 25 ug via INTRAVENOUS

## 2019-01-09 MED ORDER — ACETAMINOPHEN 325 MG PO TABS: 325.0000 mg | ORAL_TABLET | Freq: Four times a day (QID) | ORAL | Status: DC | PRN

## 2019-01-09 MED ORDER — FENTANYL CITRATE (PF) 100 MCG/2ML IJ SOLN
INTRAMUSCULAR | Status: DC | PRN
  Administered 2019-01-09 (×4): 25 ug via INTRAVENOUS

## 2019-01-09 MED ORDER — METOCLOPRAMIDE HCL 5 MG/ML IJ SOLN: 5.0000 mg | Freq: Three times a day (TID) | INTRAMUSCULAR | Status: DC | PRN

## 2019-01-09 MED ORDER — DEXAMETHASONE SODIUM PHOSPHATE 10 MG/ML IJ SOLN
INTRAMUSCULAR | Status: DC | PRN
  Administered 2019-01-09: 10 mg via INTRAVENOUS

## 2019-01-09 MED ORDER — HYDROCODONE-ACETAMINOPHEN 5-325 MG PO TABS: 1.0000 | ORAL_TABLET | ORAL | Status: DC | PRN

## 2019-01-09 MED ORDER — ACETAMINOPHEN 10 MG/ML IV SOLN
INTRAVENOUS | Status: AC
  Filled 2019-01-09: qty 100

## 2019-01-09 MED ORDER — FENTANYL CITRATE (PF) 100 MCG/2ML IJ SOLN
INTRAMUSCULAR | Status: AC
  Administered 2019-01-09: 15:00:00 25 ug via INTRAVENOUS
  Filled 2019-01-09: qty 2

## 2019-01-09 MED ORDER — MIDAZOLAM HCL 2 MG/2ML IJ SOLN
INTRAMUSCULAR | Status: AC
  Filled 2019-01-09: qty 2

## 2019-01-09 MED ORDER — PROPOFOL 10 MG/ML IV BOLUS
INTRAVENOUS | Status: AC
  Filled 2019-01-09: qty 20

## 2019-01-09 MED ORDER — ONDANSETRON HCL 4 MG/2ML IJ SOLN
INTRAMUSCULAR | Status: DC | PRN
  Administered 2019-01-09: 4 mg via INTRAVENOUS

## 2019-01-09 MED ORDER — SCOPOLAMINE 1 MG/3DAYS TD PT72
MEDICATED_PATCH | TRANSDERMAL | Status: AC
  Administered 2019-01-09: 1.5 mg via TRANSDERMAL
  Filled 2019-01-09: qty 1

## 2019-01-09 MED ORDER — METOCLOPRAMIDE HCL 10 MG PO TABS: 5.0000 mg | ORAL_TABLET | Freq: Three times a day (TID) | ORAL | Status: DC | PRN

## 2019-01-09 MED ORDER — FAMOTIDINE 20 MG PO TABS
20.0000 mg | ORAL_TABLET | Freq: Once | ORAL | Status: AC
  Administered 2019-01-09: 20 mg via ORAL

## 2019-01-09 MED ORDER — SODIUM CHLORIDE 0.9 % IV SOLN: INTRAVENOUS | Status: DC

## 2019-01-09 MED ORDER — HYDROCODONE-ACETAMINOPHEN 7.5-325 MG PO TABS: 1.0000 | ORAL_TABLET | ORAL | Status: DC | PRN

## 2019-01-09 MED ORDER — ONDANSETRON HCL 4 MG/2ML IJ SOLN: 4.0000 mg | Freq: Four times a day (QID) | INTRAMUSCULAR | Status: DC | PRN

## 2019-01-09 MED ORDER — ONDANSETRON HCL 4 MG/2ML IJ SOLN
INTRAMUSCULAR | Status: AC
  Administered 2019-01-09: 4 mg via INTRAVENOUS
  Filled 2019-01-09: qty 2

## 2019-01-09 MED ORDER — MIDAZOLAM HCL 2 MG/2ML IJ SOLN
INTRAMUSCULAR | Status: DC | PRN
  Administered 2019-01-09: 2 mg via INTRAVENOUS

## 2019-01-09 MED ORDER — SCOPOLAMINE 1 MG/3DAYS TD PT72
1.0000 | MEDICATED_PATCH | TRANSDERMAL | Status: DC
  Administered 2019-01-09: 12:00:00 1.5 mg via TRANSDERMAL

## 2019-01-09 SURGICAL SUPPLY — 30 items
BLADE INCISOR PLUS 4.5 (BLADE) ×2 IMPLANT
BNDG ELASTIC 4X5.8 VLCR STR LF (GAUZE/BANDAGES/DRESSINGS) ×2 IMPLANT
CHLORAPREP W/TINT 26 (MISCELLANEOUS) ×3 IMPLANT
COVER WAND RF STERILE (DRAPES) ×3 IMPLANT
CUFF TOURN SGL QUICK 24 (TOURNIQUET CUFF) ×2
CUFF TOURN SGL QUICK 30 (TOURNIQUET CUFF)
CUFF TRNQT CYL 24X4X16.5-23 (TOURNIQUET CUFF) IMPLANT
CUFF TRNQT CYL 30X4X21-28X (TOURNIQUET CUFF) IMPLANT
GAUZE SPONGE 4X4 12PLY STRL (GAUZE/BANDAGES/DRESSINGS) ×3 IMPLANT
GLOVE SURG SYN 9.0  PF PI (GLOVE) ×2
GLOVE SURG SYN 9.0 PF PI (GLOVE) ×1 IMPLANT
GOWN SRG 2XL LVL 4 RGLN SLV (GOWNS) ×1 IMPLANT
GOWN STRL NON-REIN 2XL LVL4 (GOWNS) ×2
GOWN STRL REUS W/ TWL LRG LVL3 (GOWN DISPOSABLE) ×2 IMPLANT
GOWN STRL REUS W/TWL LRG LVL3 (GOWN DISPOSABLE) ×4
IV LACTATED RINGER IRRG 3000ML (IV SOLUTION) ×4
IV LACTATED RINGERS 1000ML (IV SOLUTION) ×2 IMPLANT
IV LR IRRIG 3000ML ARTHROMATIC (IV SOLUTION) ×2 IMPLANT
KIT TURNOVER KIT A (KITS) ×3 IMPLANT
MANIFOLD NEPTUNE II (INSTRUMENTS) ×3 IMPLANT
NEEDLE HYPO 22GX1.5 SAFETY (NEEDLE) ×3 IMPLANT
PACK ARTHROSCOPY KNEE (MISCELLANEOUS) ×3 IMPLANT
SCALPEL PROTECTED #11 DISP (BLADE) ×3 IMPLANT
SET TUBE SUCT SHAVER OUTFL 24K (TUBING) ×3 IMPLANT
SET TUBE TIP INTRA-ARTICULAR (MISCELLANEOUS) ×3 IMPLANT
SUT ETHILON 4-0 (SUTURE) ×2
SUT ETHILON 4-0 FS2 18XMFL BLK (SUTURE) ×1
SUTURE ETHLN 4-0 FS2 18XMF BLK (SUTURE) ×1 IMPLANT
TUBING ARTHRO INFLOW-ONLY STRL (TUBING) ×3 IMPLANT
WAND COBLATION FLOW 50 (SURGICAL WAND) ×3 IMPLANT

## 2019-01-09 NOTE — Anesthesia Preprocedure Evaluation (Signed)
Anesthesia Evaluation  Patient identified by MRN, date of birth, ID band Patient awake    Reviewed: Allergy & Precautions, H&P , NPO status , Patient's Chart, lab work & pertinent test results, reviewed documented beta blocker date and time   History of Anesthesia Complications (+) PONV and history of anesthetic complications  Airway Mallampati: II  TM Distance: >3 FB Neck ROM: full    Dental  (+) Teeth Intact   Pulmonary neg pulmonary ROS,    Pulmonary exam normal        Cardiovascular Exercise Tolerance: Good negative cardio ROS Normal cardiovascular exam Rate:Normal     Neuro/Psych negative neurological ROS  negative psych ROS   GI/Hepatic negative GI ROS, Neg liver ROS,   Endo/Other  negative endocrine ROS  Renal/GU negative Renal ROS  negative genitourinary   Musculoskeletal   Abdominal   Peds  Hematology negative hematology ROS (+)   Anesthesia Other Findings   Reproductive/Obstetrics negative OB ROS                             Anesthesia Physical Anesthesia Plan  ASA: II  Anesthesia Plan: General LMA   Post-op Pain Management:    Induction:   PONV Risk Score and Plan: 4 or greater  Airway Management Planned:   Additional Equipment:   Intra-op Plan:   Post-operative Plan:   Informed Consent: I have reviewed the patients History and Physical, chart, labs and discussed the procedure including the risks, benefits and alternatives for the proposed anesthesia with the patient or authorized representative who has indicated his/her understanding and acceptance.       Plan Discussed with: CRNA  Anesthesia Plan Comments:         Anesthesia Quick Evaluation

## 2019-01-09 NOTE — Op Note (Signed)
01/09/2019  2:10 PM  PATIENT:  Frederich Cha  63 y.o. female  PRE-OPERATIVE DIAGNOSIS:  COMPLEX TEAR OF LATERAL MENISCUS  POST-OPERATIVE DIAGNOSIS:  COMPLEX TEAR OF LATERAL MENISCUS with medial meniscus tear  PROCEDURE:  Procedure(s): ARTHROSCOPY KNEE, PARTIAL MEDIAL AND LATERAL MENISECTOMY (Left)  SURGEON: Laurene Footman, MD  ASSISTANTS: None  ANESTHESIA:   general  EBL:  No intake/output data recorded.  BLOOD ADMINISTERED:none  DRAINS: none   LOCAL MEDICATIONS USED:  MARCAINE     SPECIMEN:  No Specimen  DISPOSITION OF SPECIMEN:  N/A  COUNTS:  YES  TOURNIQUET:  * Missing tourniquet times found for documented tourniquets in log: [id number] *none  IMPLANTS: None  DICTATION: .Dragon Dictation patient was brought to the operating room and after adequate anesthesia was obtained the left leg was placed in the arthroscopic leg holder with tourniquet applied but not required during surgery.  After prepping and draping in the usual sterile fashion, appropriate patient identification and timeout procedures were completed.  An inferior lateral portal was made and the arthroscope introduced showing band in the suprapatellar pouch which in an inferomedial portal was made and a grasper placed up in the knee there is appear to be a possible loose body but ended up being of large flap of synovium which was removed.  The gutters were then checked and there were no loose bodies there was mild patellofemoral degenerative changes with fibrillation on the lateral facet of the patella and normal tracking.  Going the medial compartment knee on probing there was a tear in the middle third present that in an area that had had abnormal signal on the MRI this was ablated with the use of an ArthroCare wand the remainder of the meniscus appeared stable and the articular cartilage was essentially normal.  ACL was intact going the lateral compartment there was a complex tear of the anterior horn of the meniscus  consistent with MRI findings meniscal punch was used after switching portals to use the backbiting punch to get the anterior horn tear that along with a shaver and ArthroCare wand were used to get back to a stable margin there was mild fibrillation of the lateral compartment but minimal degenerative change.  After thorough irrigation of the knee all instrumentation was withdrawn and the wounds closed with simple erupted 4-0 nylon.  20 cc of half percent Sensorcaine infiltrated into the area of the incisions for postop analgesia.  Xeroform 4 x 4 web roll and Ace wrap applied  PLAN OF CARE: Discharge to home after PACU  PATIENT DISPOSITION:  PACU - hemodynamically stable.

## 2019-01-09 NOTE — Anesthesia Procedure Notes (Signed)
Procedure Name: LMA Insertion Date/Time: 01/09/2019 1:25 PM Performed by: Doreen Salvage, CRNA Pre-anesthesia Checklist: Patient identified, Patient being monitored, Timeout performed, Emergency Drugs available and Suction available Patient Re-evaluated:Patient Re-evaluated prior to induction Oxygen Delivery Method: Circle system utilized Preoxygenation: Pre-oxygenation with 100% oxygen Induction Type: IV induction Ventilation: Mask ventilation without difficulty LMA: LMA inserted LMA Size: 4.0 Tube type: Oral Number of attempts: 1 Placement Confirmation: positive ETCO2 and breath sounds checked- equal and bilateral Tube secured with: Tape Dental Injury: Teeth and Oropharynx as per pre-operative assessment

## 2019-01-09 NOTE — Discharge Instructions (Addendum)
Weightbearing as tolerated on left leg.  Try to minimize activities through the weekend.  Pain medicine as directed.  Aspirin should be continued starting today.  Call if you are having problems.  AMBULATORY SURGERY  DISCHARGE INSTRUCTIONS   1) The drugs that you were given will stay in your system until tomorrow so for the next 24 hours you should not:  A) Drive an automobile B) Make any legal decisions C) Drink any alcoholic beverage   2) You may resume regular meals tomorrow.  Today it is better to start with liquids and gradually work up to solid foods.  You may eat anything you prefer, but it is better to start with liquids, then soup and crackers, and gradually work up to solid foods.   3) Please notify your doctor immediately if you have any unusual bleeding, trouble breathing, redness and pain at the surgery site, drainage, fever, or pain not relieved by medication.    4) Additional Instructions:        Please contact your physician with any problems or Same Day Surgery at [phone], Monday through Friday 6 am to 4 pm, or [place] at [facility] number at [phone].

## 2019-01-09 NOTE — Transfer of Care (Signed)
Immediate Anesthesia Transfer of Care Note  Patient: Virginia Fritz  Procedure(s) Performed: Procedure(s): ARTHROSCOPY KNEE, PARTIAL MEDIAL AND LATERAL MENISECTOMY (Left)  Patient Location: PACU  Anesthesia Type:General  Level of Consciousness: sedated  Airway & Oxygen Therapy: Patient Spontanous Breathing and Patient connected to face mask oxygen  Post-op Assessment: Report given to RN and Post -op Vital signs reviewed and stable  Post vital signs: Reviewed and stable  Last Vitals:  Vitals:   01/09/19 1200 01/09/19 1410  BP: 121/85 119/74  Pulse: 89 (!) 111  Resp: 16 14  Temp: 36.6 C 36.6 C  SpO2: 158% 727%    Complications: No apparent anesthesia complications

## 2019-01-09 NOTE — H&P (Signed)
Reviewed paper H+P, will be scanned into chart. No changes noted.  

## 2019-01-09 NOTE — Anesthesia Post-op Follow-up Note (Signed)
Anesthesia QCDR form completed.        

## 2019-01-15 NOTE — Anesthesia Postprocedure Evaluation (Signed)
Anesthesia Post Note  Patient: Virginia Fritz  Procedure(s) Performed: ARTHROSCOPY KNEE, PARTIAL MEDIAL AND LATERAL MENISECTOMY (Left )  Patient location during evaluation: PACU Anesthesia Type: General Level of consciousness: awake and alert Pain management: pain level controlled Vital Signs Assessment: post-procedure vital signs reviewed and stable Respiratory status: spontaneous breathing, nonlabored ventilation, respiratory function stable and patient connected to nasal cannula oxygen Cardiovascular status: blood pressure returned to baseline and stable Postop Assessment: no apparent nausea or vomiting Anesthetic complications: no     Last Vitals:  Vitals:   01/09/19 1513 01/09/19 1541  BP: 119/67 123/71  Pulse: 67 74  Resp: 16 16  Temp:    SpO2: 100% 99%    Last Pain:  Vitals:   01/10/19 0836  TempSrc:   PainSc: 0-No pain                 Molli Barrows

## 2019-06-08 ENCOUNTER — Other Ambulatory Visit: Payer: Self-pay

## 2019-06-08 ENCOUNTER — Ambulatory Visit
Admission: EM | Admit: 2019-06-08 | Discharge: 2019-06-08 | Disposition: A | Discharge status: Home / Self Care | Payer: Commercial Managed Care - PPO | Attending: Family Medicine | Admitting: Family Medicine

## 2019-06-08 DIAGNOSIS — Z808 Family history of malignant neoplasm of other organs or systems: Secondary | ICD-10-CM | POA: Diagnosis not present

## 2019-06-08 DIAGNOSIS — B349 Viral infection, unspecified: Secondary | ICD-10-CM | POA: Diagnosis present

## 2019-06-08 DIAGNOSIS — Z885 Allergy status to narcotic agent status: Secondary | ICD-10-CM | POA: Diagnosis not present

## 2019-06-08 DIAGNOSIS — Z803 Family history of malignant neoplasm of breast: Secondary | ICD-10-CM | POA: Diagnosis not present

## 2019-06-08 DIAGNOSIS — Z880 Allergy status to penicillin: Secondary | ICD-10-CM | POA: Insufficient documentation

## 2019-06-08 DIAGNOSIS — M1990 Unspecified osteoarthritis, unspecified site: Secondary | ICD-10-CM | POA: Insufficient documentation

## 2019-06-08 DIAGNOSIS — R519 Headache, unspecified: Secondary | ICD-10-CM

## 2019-06-08 DIAGNOSIS — Z91041 Radiographic dye allergy status: Secondary | ICD-10-CM | POA: Diagnosis not present

## 2019-06-08 DIAGNOSIS — Z833 Family history of diabetes mellitus: Secondary | ICD-10-CM | POA: Insufficient documentation

## 2019-06-08 DIAGNOSIS — R6883 Chills (without fever): Secondary | ICD-10-CM

## 2019-06-08 DIAGNOSIS — M791 Myalgia, unspecified site: Secondary | ICD-10-CM

## 2019-06-08 DIAGNOSIS — R5383 Other fatigue: Secondary | ICD-10-CM | POA: Diagnosis not present

## 2019-06-08 DIAGNOSIS — Z79899 Other long term (current) drug therapy: Secondary | ICD-10-CM | POA: Diagnosis not present

## 2019-06-08 DIAGNOSIS — Z20822 Contact with and (suspected) exposure to covid-19: Secondary | ICD-10-CM

## 2019-06-08 DIAGNOSIS — R05 Cough: Secondary | ICD-10-CM

## 2019-06-08 DIAGNOSIS — Z90722 Acquired absence of ovaries, bilateral: Secondary | ICD-10-CM | POA: Insufficient documentation

## 2019-06-08 DIAGNOSIS — U071 COVID-19: Secondary | ICD-10-CM | POA: Diagnosis not present

## 2019-06-08 DIAGNOSIS — Z7982 Long term (current) use of aspirin: Secondary | ICD-10-CM | POA: Insufficient documentation

## 2019-06-08 DIAGNOSIS — Z86711 Personal history of pulmonary embolism: Secondary | ICD-10-CM | POA: Insufficient documentation

## 2019-06-08 DIAGNOSIS — R110 Nausea: Secondary | ICD-10-CM

## 2019-06-08 MED ORDER — BENZONATATE 200 MG PO CAPS: 200.0000 mg | ORAL_CAPSULE | Freq: Three times a day (TID) | ORAL | 0 refills | Status: DC | PRN

## 2019-06-08 NOTE — ED Triage Notes (Signed)
Onset 10 days has headache, cough, weakness, dizzy, nausea her husband Covid test was positive denies fever but has chills, body ache

## 2019-06-08 NOTE — ED Provider Notes (Signed)
[facility]    CSN: [id number] Arrival date & time: 06/08/19  [id number]      History   Chief Complaint No chief complaint on file.   HPI Virginia Fritz is a 64 y.o. female.   64 yo female with a c/o cough, fatigue, headache, nausea, chills, body aches for the past 10 days. States her husband had tested positive for covid.      Past Medical History:  Diagnosis Date  . Arthritis   . Atypical ductal hyperplasia of right breast February 2,2015   1 mm foci of ADH without margin involvement  . Colon polyps   . Environmental and seasonal allergies   . Hx of blood clots 11/1996   PE post-operative on OCPs  . Hyperlipidemia   . Hyperlipidemia   . Kidney calculi May 2014  . Knee pain    Lt  . Ovarian cyst   . PE (pulmonary thromboembolism) ([place]) [id number]  . PONV (postoperative nausea and vomiting)     Patient Active Problem List   Diagnosis Date Noted  . Atypical ductal hyperplasia of right breast 06/25/2013    Past Surgical History:  Procedure Laterality Date  . ABDOMINAL HYSTERECTOMY  05/1997  . BREAST SURGERY Right 07/02/2013   Foci of ADH on core biopsy, no upstaging on formal excision.  Marland Kitchen [place]  . COLONOSCOPY  Dec 2013   [name]  . FOOT SURGERY  [phone]  . KIDNEY STONE SURGERY    . KNEE ARTHROSCOPY Left 01/09/2019   Procedure: ARTHROSCOPY KNEE, PARTIAL MEDIAL AND LATERAL MENISECTOMY;  Surgeon: Hessie Knows, MD;  Location: [facility] ORS;  Service: Orthopedics;  Laterality: Left;  . LASIK  1994  . LITHOTRIPSY  May 2014  . [place]  . TOTAL ABDOMINAL HYSTERECTOMY W/ BILATERAL SALPINGOOPHORECTOMY  02/2003  . TUBAL LIGATION      OB History    Gravida  2   Para  2   Term      Preterm      AB      Living  2     SAB      TAB      Ectopic      Multiple      Live Births           Obstetric Comments  Menstrual age: 66  Age 1st Pregnancy: 80         Home Medications    Prior to Admission  medications   Medication Sig Start Date End Date Taking? Authorizing Provider  aspirin 81 MG tablet Take 81 mg by mouth daily.   Yes [provider]  Calcipotriene-Betameth Diprop (ENSTILAR) 0.005-0.064 % FOAM Apply 1 application topically daily as needed (psoriasis flare).   Yes [provider]  Calcium Carb-Cholecalciferol (CALCIUM + D3 PO) Take 1 tablet by mouth daily.   Yes [provider]  Desoximetasone (TOPICORT) 0.25 % LIQD Apply 1 spray topically daily as needed (psoriasis).   Yes [provider]  diphenhydrAMINE (BENADRYL) 25 MG tablet Take 25 mg by mouth daily as needed for itching.   Yes [provider]  diphenhydrAMINE-zinc acetate (BENADRYL) cream Apply 1 application topically daily as needed for itching.   Yes [provider]  estradiol (ESTRACE) 0.1 MG/GM vaginal cream Insert 1g once weekly as maintenace Patient taking differently: Place 1 Applicatorful vaginally daily as needed (dryness).  [id number]  Yes Copland, Alicia B, PA-C  fluticasone (FLONASE) 50 MCG/ACT nasal spray Place 1  spray into both nostrils daily as needed for allergies or rhinitis.   Yes [provider]  Multiple Vitamin (MULTI-VITAMINS) TABS Take 1 tablet by mouth daily.   Yes [provider]  TALTZ 80 MG/ML SOAJ Inject 80 mg as directed every 28 (twenty-eight) days. 10/24/18  Yes [provider]  vitamin B-12 (CYANOCOBALAMIN) 1000 MCG tablet Take 1,000 mcg by mouth once a week.   Yes [provider]  Vitamin D, Cholecalciferol, 50 MCG (2000 UT) CAPS Take 6,000 Units by mouth daily.   Yes [provider]  benzonatate (TESSALON) 200 MG capsule Take 1 capsule (200 mg total) by mouth 3 (three) times daily as needed. 06/08/19   Norval Gable, MD  HYDROcodone-acetaminophen (NORCO) 5-325 MG tablet Take 1 tablet by mouth every 4 (four) hours as needed for moderate pain. 01/09/19   Hessie Knows, MD  naproxen sodium (ALEVE) 220 MG  tablet Take 440 mg by mouth 2 (two) times daily as needed (pain).    [provider]    Family History Family History  Problem Relation Age of Onset  . Diabetes Father   . Hypertension Father   . Hyperlipidemia Father   . Breast cancer Sister 59  . Melanoma Sister   . Breast cancer Paternal Aunt 37    Social History Social History   Tobacco Use  . Smoking status: Never Smoker  . Smokeless tobacco: Never Used  Substance Use Topics  . Alcohol use: Yes    Comment: rarely  . Drug use: No     Allergies   Amoxil [amoxicillin], Ivp dye [iodinated diagnostic agents], and Percocet [oxycodone-acetaminophen]   Review of Systems Review of Systems   Physical Exam Triage Vital Signs ED Triage Vitals  Enc Vitals Group     BP 06/08/19 0830 103/71     Pulse Rate 06/08/19 0830 (!) 101     Resp 06/08/19 0830 16     Temp 06/08/19 0830 98.4 F (36.9 C)     Temp Source 06/08/19 0830 Oral     SpO2 06/08/19 0830 99 %     Weight 06/08/19 0828 169 lb (76.7 kg)     Height 06/08/19 0828 5\' 8"  (1.727 m)     Head Circumference --      Peak Flow --      Pain Score 06/08/19 0827 8     Pain Loc --      Pain Edu? --      Excl. in [place]? --    No data found.  Updated Vital Signs BP 103/71 (BP Location: Left Arm)   Pulse (!) 101   Temp 98.4 F (36.9 C) (Oral)   Resp 16   Ht 5\' 8"  (1.727 m)   Wt 76.7 kg   SpO2 99%   BMI 25.70 kg/m   Visual Acuity Right Eye Distance:   Left Eye Distance:   Bilateral Distance:    Right Eye Near:   Left Eye Near:    Bilateral Near:     Physical Exam Vitals and nursing note reviewed.  Constitutional:      General: She is not in acute distress.    Appearance: She is not toxic-appearing or diaphoretic.  Cardiovascular:     Rate and Rhythm: Tachycardia present.  Pulmonary:     Effort: Pulmonary effort is normal. No respiratory distress.  Neurological:     Mental Status: She is alert.      UC Treatments / Results  Labs (all  labs ordered are  listed, but only abnormal results are displayed) Labs Reviewed  NOVEL CORONAVIRUS, NAA (HOSP ORDER, SEND-OUT TO REF LAB; TAT 18-24 HRS)    EKG   Radiology No results found.  Procedures Procedures (including critical care time)  Medications Ordered in UC Medications - No data to display  Initial Impression / Assessment and Plan / UC Course  I have reviewed the triage vital signs and the nursing notes.  Pertinent labs & imaging results that were available during my care of the patient were reviewed by me and considered in my medical decision making (see chart for details).      Final Clinical Impressions(s) / UC Diagnoses   Final diagnoses:  Viral syndrome     Discharge Instructions     Rest, fluids, over the counter medications Self isolate and await covid test    ED Prescriptions    Medication Sig Dispense Auth. Provider   benzonatate (TESSALON) 200 MG capsule Take 1 capsule (200 mg total) by mouth 3 (three) times daily as needed. 30 capsule Norval Gable, MD      1. diagnosis reviewed with patient 2. rx as per orders above; reviewed possible side effects, interactions, risks and benefits  3. Recommend supportive treatment as above 4. Await covid test 5. Follow-up prn if symptoms worsen or don't improve   PDMP not reviewed this encounter.   Norval Gable, MD 06/08/19 [phone]

## 2019-06-08 NOTE — Discharge Instructions (Signed)
Rest, fluids, over the counter medications Self isolate and await covid test

## 2019-06-09 LAB — NOVEL CORONAVIRUS, NAA (HOSP ORDER, SEND-OUT TO REF LAB; TAT 18-24 HRS): SARS-CoV-2, NAA: DETECTED — AB

## 2019-06-10 ENCOUNTER — Telehealth (HOSPITAL_COMMUNITY): Payer: Self-pay | Admitting: Emergency Medicine

## 2019-06-10 NOTE — Telephone Encounter (Signed)

## 2019-07-09 NOTE — Progress Notes (Signed)
PCP: Patient, No Pcp Per   Chief Complaint  Patient presents with  . Gynecologic Exam    HPI:      Virginia Fritz is a 64 y.o. G2P2 who LMP was No LMP recorded. Patient has had a hysterectomy., presents today for her annual examination.  Her menses are absent due to hyst. S/P TAH 1999 due to DUB, s/p BSO a few yrs later due to ovar cysts. She does not have intermenstrual bleeding.  She has occas vasomotor sx. She is s/p PE on OCPs so can't have ERT.  Sex activity: single partner, contraception - post menopausal status. She does have vaginal dryness. She uses estrace crm with some relief. Also diagnosed with vaginal psoriasis recently and doing tx.   Last Pap: 02/27/17  Results were: no abnormalities /neg HPV DNA.  Hx of STDs: none  Last mammogram: 01/03/19 at [facility].  Results were: normal--routine follow-up in 12 months There is a FH of breast cancer in her sister and pat aunt, genetic testing not indicated. There is no FH of ovarian cancer. The patient does do self-breast exams. She has a hx of atypical ductal hyperplasia RT breast 2015, followed by Dr. Bary Castilla for awhile.  Colonoscopy: colonoscopy 2013 without abnormalities.  Repeat due after 10 years.   Tobacco use: The patient denies current or previous tobacco use. Alcohol use: none  No drug use Exercise: moderately active until meniscus surgery and recent covid  She does get adequate calcium and Vitamin D in her diet.  Borderline lipids 10/18 and Vit D deficiency 9/17. Due for labs today.   Past Medical History:  Diagnosis Date  . Arthritis   . Atypical ductal hyperplasia of right breast February 2,2015   1 mm foci of ADH without margin involvement  . Colon polyps   . Environmental and seasonal allergies   . Hx of blood clots 11/1996   PE post-operative on OCPs  . Hyperlipidemia   . Hyperlipidemia   . Kidney calculi May 2014  . Knee pain    Lt  . Ovarian cyst   . PE (pulmonary thromboembolism) ([place]) [id number]   . PONV (postoperative nausea and vomiting)     Past Surgical History:  Procedure Laterality Date  . ABDOMINAL HYSTERECTOMY  05/1997  . BREAST SURGERY Right 07/02/2013   Foci of ADH on core biopsy, no upstaging on formal excision.  Marland Kitchen [place]  . COLONOSCOPY  Dec 2013   [name]  . FOOT SURGERY  [phone]  . KIDNEY STONE SURGERY    . KNEE ARTHROSCOPY Left 01/09/2019   Procedure: ARTHROSCOPY KNEE, PARTIAL MEDIAL AND LATERAL MENISECTOMY;  Surgeon: Hessie Knows, MD;  Location: [facility] ORS;  Service: Orthopedics;  Laterality: Left;  . LASIK  1994  . LITHOTRIPSY  May 2014  . [place]  . TOTAL ABDOMINAL HYSTERECTOMY W/ BILATERAL SALPINGOOPHORECTOMY  02/2003  . TUBAL LIGATION      Family History  Problem Relation Age of Onset  . Diabetes Father   . Hypertension Father   . Hyperlipidemia Father   . Breast cancer Sister 59  . Melanoma Sister   . Breast cancer Paternal Aunt 24    Social History   Socioeconomic History  . Marital status: Married    Spouse name: Not on file  . Number of children: Not on file  . Years of education: Not on file  . Highest education level: Not on file  Occupational History  . Not on file  Tobacco Use  . Smoking status: Never Smoker  . Smokeless tobacco: Never Used  Substance and Sexual Activity  . Alcohol use: Yes    Comment: rarely  . Drug use: No  . Sexual activity: Yes    Birth control/protection: Surgical    Comment: Hysterectomy  Other Topics Concern  . Not on file  Social History Narrative  . Not on file   Social Determinants of Health   Financial Resource Strain:   . Difficulty of Paying Living Expenses: Not on file  Food Insecurity:   . Worried About Charity fundraiser in the Last Year: Not on file  . Ran Out of Food in the Last Year: Not on file  Transportation Needs:   . Lack of Transportation (Medical): Not on file  . Lack of Transportation (Non-Medical): Not on file  Physical Activity:    . Days of Exercise per Week: Not on file  . Minutes of Exercise per Session: Not on file  Stress:   . Feeling of Stress : Not on file  Social Connections:   . Frequency of Communication with Friends and Family: Not on file  . Frequency of Social Gatherings with Friends and Family: Not on file  . Attends Religious Services: Not on file  . Active Member of Clubs or Organizations: Not on file  . Attends Archivist Meetings: Not on file  . Marital Status: Not on file  Intimate Partner Violence:   . Fear of Current or Ex-Partner: Not on file  . Emotionally Abused: Not on file  . Physically Abused: Not on file  . Sexually Abused: Not on file    Current Meds  Medication Sig  . aspirin 81 MG tablet Take 81 mg by mouth daily.  . Calcipotriene-Betameth Diprop (ENSTILAR) 0.005-0.064 % FOAM Apply 1 application topically daily as needed (psoriasis flare).  . Calcium Carb-Cholecalciferol (CALCIUM + D3 PO) Take 1 tablet by mouth daily.  . Desoximetasone (TOPICORT) 0.25 % LIQD Apply 1 spray topically daily as needed (psoriasis).  . diphenhydrAMINE (BENADRYL) 25 MG tablet Take 25 mg by mouth daily as needed for itching.  . diphenhydrAMINE-zinc acetate (BENADRYL) cream Apply 1 application topically daily as needed for itching.  . estradiol (ESTRACE) 0.1 MG/GM vaginal cream Insert 1g once weekly as maintenace  . fluticasone (FLONASE) 50 MCG/ACT nasal spray Place 1 spray into both nostrils daily as needed for allergies or rhinitis.  . Multiple Vitamin (MULTI-VITAMINS) TABS Take 1 tablet by mouth daily.  . naproxen sodium (ALEVE) 220 MG tablet Take 440 mg by mouth 2 (two) times daily as needed (pain).  . nystatin-triamcinolone (MYCOLOG II) cream APP TO CORNERS OF MOUTH QID  . TALTZ 80 MG/ML SOAJ Inject 80 mg as directed every 28 (twenty-eight) days.  . vitamin B-12 (CYANOCOBALAMIN) 1000 MCG tablet Take 1,000 mcg by mouth once a week.  . Vitamin D, Cholecalciferol, 50 MCG (2000 UT) CAPS Take  6,000 Units by mouth daily.  . [DISCONTINUED] estradiol (ESTRACE) 0.1 MG/GM vaginal cream Insert 1g once weekly as maintenace (Patient taking differently: Place 1 Applicatorful vaginally daily as needed (dryness). )      ROS:  Review of Systems  Constitutional: Negative for fatigue, fever and unexpected weight change.  Respiratory: Negative for cough, shortness of breath and wheezing.   Cardiovascular: Negative for chest pain, palpitations and leg swelling.  Gastrointestinal: Negative for blood in stool, constipation, diarrhea, nausea and vomiting.  Endocrine: Negative for cold intolerance, heat intolerance and polyuria.  Genitourinary: Negative  for dyspareunia, dysuria, flank pain, frequency, genital sores, hematuria, menstrual problem, pelvic pain, urgency, vaginal bleeding, vaginal discharge and vaginal pain.  Musculoskeletal: Negative for back pain, joint swelling and myalgias.  Skin: Negative for rash.  Neurological: Negative for dizziness, syncope, light-headedness, numbness and headaches.  Hematological: Negative for adenopathy.  Psychiatric/Behavioral: Negative for agitation, confusion, sleep disturbance and suicidal ideas. The patient is not nervous/anxious.      Objective: BP 110/80   Ht 5\' 8"  (1.727 m)   Wt 173 lb (78.5 kg)   BMI 26.30 kg/m    Physical Exam Constitutional:      Appearance: She is well-developed.  Genitourinary:     Vulva, vagina, cervix, uterus, right adnexa and left adnexa normal.     No vulval lesion or tenderness noted.     No vaginal discharge, erythema or tenderness.     No cervical polyp.     Uterus is not enlarged or tender.     No right or left adnexal mass present.     Right adnexa not tender.     Left adnexa not tender.     Genitourinary Comments: UTERUS/CX SURG REM  Neck:     Thyroid: No thyromegaly.  Cardiovascular:     Rate and Rhythm: Normal rate and regular rhythm.     Heart sounds: Normal heart sounds. No murmur.  Pulmonary:      Effort: Pulmonary effort is normal.     Breath sounds: Normal breath sounds.  Chest:     Breasts:        Right: No mass, nipple discharge, skin change or tenderness.        Left: No mass, nipple discharge, skin change or tenderness.  Abdominal:     Palpations: Abdomen is soft.     Tenderness: There is no abdominal tenderness. There is no guarding.  Musculoskeletal:        General: Normal range of motion.     Cervical back: Normal range of motion.  Neurological:     General: No focal deficit present.     Mental Status: She is alert and oriented to person, place, and time.     Cranial Nerves: No cranial nerve deficit.  Skin:    General: Skin is warm and dry.  Psychiatric:        Mood and Affect: Mood normal.        Behavior: Behavior normal.        Thought Content: Thought content normal.        Judgment: Judgment normal.  Vitals reviewed.     Assessment/Plan:  Encounter for annual routine gynecological examination  Encounter for screening mammogram for malignant neoplasm of breast - Plan: MM 3D SCREEN BREAST BILATERAL; pt has mammo appt at [facility] 8/21  Vaginal dryness, menopausal - Plan: estradiol (ESTRACE) 0.1 MG/GM vaginal cream; Rx RF estrace crm/lubricants.  Dyspareunia in female - Plan: estradiol (ESTRACE) 0.1 MG/GM vaginal cream  Blood tests for routine general physical examination - Plan: Comprehensive metabolic panel, Lipid panel  Screening cholesterol level - Plan: Lipid panel   Meds ordered this encounter  Medications  . estradiol (ESTRACE) 0.1 MG/GM vaginal cream    Sig: Insert 1g once weekly as maintenace    Dispense:  42.5 g    Refill:  1    Order Specific Question:   Supervising Provider    Answer:   Gae Dry [id number]           GYN counsel breast self exam, mammography screening,  menopause, adequate intake of calcium and vitamin D, diet and exercise    F/U  Return in about 1 year (around 07/09/2020).  Virginia Monk B. Blandon Offerdahl,  PA-C 07/10/2019 11:25 AM

## 2019-07-10 ENCOUNTER — Other Ambulatory Visit: Payer: Self-pay

## 2019-07-10 ENCOUNTER — Ambulatory Visit (INDEPENDENT_AMBULATORY_CARE_PROVIDER_SITE_OTHER): Payer: Commercial Managed Care - PPO | Admitting: Obstetrics and Gynecology

## 2019-07-10 ENCOUNTER — Encounter: Payer: Self-pay | Admitting: Obstetrics and Gynecology

## 2019-07-10 VITALS — BP 110/80 | Ht 68.0 in | Wt 173.0 lb

## 2019-07-10 DIAGNOSIS — N951 Menopausal and female climacteric states: Secondary | ICD-10-CM | POA: Insufficient documentation

## 2019-07-10 DIAGNOSIS — Z13220 Encounter for screening for lipoid disorders: Secondary | ICD-10-CM

## 2019-07-10 DIAGNOSIS — Z90710 Acquired absence of both cervix and uterus: Secondary | ICD-10-CM

## 2019-07-10 DIAGNOSIS — Z01419 Encounter for gynecological examination (general) (routine) without abnormal findings: Secondary | ICD-10-CM | POA: Diagnosis not present

## 2019-07-10 DIAGNOSIS — Z0000 Encounter for general adult medical examination without abnormal findings: Secondary | ICD-10-CM

## 2019-07-10 DIAGNOSIS — Z1231 Encounter for screening mammogram for malignant neoplasm of breast: Secondary | ICD-10-CM

## 2019-07-10 DIAGNOSIS — N898 Other specified noninflammatory disorders of vagina: Secondary | ICD-10-CM | POA: Diagnosis not present

## 2019-07-10 DIAGNOSIS — N941 Unspecified dyspareunia: Secondary | ICD-10-CM | POA: Diagnosis not present

## 2019-07-10 MED ORDER — ESTRADIOL 0.1 MG/GM VA CREA: TOPICAL_CREAM | VAGINAL | 1 refills | Status: DC

## 2019-07-10 NOTE — Patient Instructions (Signed)
I value your feedback and entrusting us with your care. If you get a [facility] patient survey, I would appreciate you taking the time to let us know about your experience today. Thank you!  As of May 08, 2019, your lab results will be released to your MyChart immediately, before I even have a chance to see them. Please give me time to review them and contact you if there are any abnormalities. Thank you for your patience.     [facility]: [phone]  

## 2019-07-11 LAB — COMPREHENSIVE METABOLIC PANEL
ALT: 28 IU/L (ref 0–32)
AST: 29 IU/L (ref 0–40)
Albumin/Globulin Ratio: 1.6 (ref 1.2–2.2)
Albumin: 4.4 g/dL (ref 3.8–4.8)
Alkaline Phosphatase: 99 IU/L (ref 39–117)
BUN/Creatinine Ratio: 22 (ref 12–28)
BUN: 19 mg/dL (ref 8–27)
Bilirubin Total: 0.5 mg/dL (ref 0.0–1.2)
CO2: 22 mmol/L (ref 20–29)
Calcium: 9.4 mg/dL (ref 8.7–10.3)
Chloride: 104 mmol/L (ref 96–106)
Creatinine, Ser: 0.86 mg/dL (ref 0.57–1.00)
GFR calc Af Amer: 84 mL/min/{1.73_m2} (ref >59)
GFR calc non Af Amer: 73 mL/min/{1.73_m2} (ref >59)
Globulin, Total: 2.7 g/dL (ref 1.5–4.5)
Glucose: 95 mg/dL (ref 65–99)
Potassium: 4.1 mmol/L (ref 3.5–5.2)
Sodium: 141 mmol/L (ref 134–144)
Total Protein: 7.1 g/dL (ref 6.0–8.5)

## 2019-07-11 LAB — LIPID PANEL
Chol/HDL Ratio: 5.3 ratio — ABNORMAL HIGH (ref 0.0–4.4)
Cholesterol, Total: 234 mg/dL — ABNORMAL HIGH (ref 100–199)
HDL: 44 mg/dL (ref >39)
LDL Chol Calc (NIH): 158 mg/dL — ABNORMAL HIGH (ref 0–99)
Triglycerides: 173 mg/dL — ABNORMAL HIGH (ref 0–149)
VLDL Cholesterol Cal: 32 mg/dL (ref 5–40)

## 2019-09-08 ENCOUNTER — Ambulatory Visit: Payer: Commercial Managed Care - PPO

## 2020-01-14 ENCOUNTER — Encounter: Payer: Self-pay | Admitting: Obstetrics and Gynecology

## 2020-01-19 ENCOUNTER — Encounter: Payer: Self-pay | Admitting: Obstetrics and Gynecology

## 2020-11-23 ENCOUNTER — Ambulatory Visit: Payer: Commercial Managed Care - PPO | Admitting: Obstetrics and Gynecology

## 2020-12-22 ENCOUNTER — Other Ambulatory Visit (HOSPITAL_COMMUNITY)
Admission: RE | Admit: 2020-12-22 | Discharge: 2020-12-22 | Disposition: A | Discharge status: Home / Self Care | Payer: Commercial Managed Care - PPO | Source: Ambulatory Visit | Attending: Obstetrics and Gynecology | Admitting: Obstetrics and Gynecology

## 2020-12-22 ENCOUNTER — Encounter: Payer: Self-pay | Admitting: Obstetrics and Gynecology

## 2020-12-22 ENCOUNTER — Ambulatory Visit (INDEPENDENT_AMBULATORY_CARE_PROVIDER_SITE_OTHER): Payer: Commercial Managed Care - PPO | Admitting: Obstetrics and Gynecology

## 2020-12-22 ENCOUNTER — Other Ambulatory Visit: Payer: Self-pay

## 2020-12-22 VITALS — BP 100/80 | Ht 68.0 in | Wt 166.0 lb

## 2020-12-22 DIAGNOSIS — Z1231 Encounter for screening mammogram for malignant neoplasm of breast: Secondary | ICD-10-CM | POA: Diagnosis not present

## 2020-12-22 DIAGNOSIS — E785 Hyperlipidemia, unspecified: Secondary | ICD-10-CM

## 2020-12-22 DIAGNOSIS — N951 Menopausal and female climacteric states: Secondary | ICD-10-CM

## 2020-12-22 DIAGNOSIS — Z1151 Encounter for screening for human papillomavirus (HPV): Secondary | ICD-10-CM | POA: Diagnosis present

## 2020-12-22 DIAGNOSIS — Z01419 Encounter for gynecological examination (general) (routine) without abnormal findings: Secondary | ICD-10-CM | POA: Diagnosis not present

## 2020-12-22 DIAGNOSIS — N941 Unspecified dyspareunia: Secondary | ICD-10-CM

## 2020-12-22 DIAGNOSIS — Z0000 Encounter for general adult medical examination without abnormal findings: Secondary | ICD-10-CM

## 2020-12-22 DIAGNOSIS — Z124 Encounter for screening for malignant neoplasm of cervix: Secondary | ICD-10-CM | POA: Insufficient documentation

## 2020-12-22 MED ORDER — ESTRADIOL 0.1 MG/GM VA CREA: TOPICAL_CREAM | VAGINAL | 1 refills | Status: AC

## 2020-12-22 NOTE — Patient Instructions (Signed)
I value your feedback and you entrusting us with your care. If you get a [facility] patient survey, I would appreciate you taking the time to let us know about your experience today. Thank you!   [facility]: [phone]  

## 2020-12-22 NOTE — Progress Notes (Signed)
PCP: Patient, No Pcp Per (Inactive)   Chief Complaint  Patient presents with   Gynecologic Exam    No concerns    HPI:      Ms. Virginia Fritz is a 65 y.o. G2P2 who LMP was No LMP recorded. Patient has had a hysterectomy., presents today for her annual examination.  Her menses are absent due to hyst. S/P TAH 1999 due to DUB, s/p BSO a few yrs later due to ovar cysts. She does not have PMB.   She has occas vasomotor sx which have been worse recently. Pt also under increased stress with health issues in her mom. She is s/p PE on OCPs so can't have ERT.  Sex activity: single partner, contraception - post menopausal status. She does have vaginal dryness. She uses estrace crm once wkly with some relief. Still has some pain at the vaginal opening and wondered about increasing the dose. Also diagnosed with vaginal psoriasis and did tx.   Last Pap: 02/27/17  Results were: no abnormalities /neg HPV DNA.  Hx of STDs: none  Last mammogram: 01/14/20 at [facility].  Results were: normal--routine follow-up in 12 months There is a FH of breast cancer in her sister and pat aunt, genetic testing not indicated. There is no FH of ovarian cancer. The patient does self-breast exams. She has a hx of atypical ductal hyperplasia RT breast 2015, followed by Dr. Bary Castilla for awhile.  Colonoscopy: colonoscopy 2013 without abnormalities at [facility].  Repeat due after 10 years.   Tobacco use: The patient denies current or previous tobacco use. Alcohol use: none  No drug use Exercise: moderately active; has lost wt from last yr  She does get adequate calcium and Vitamin D in her diet. Now on Rx Vit D.  Borderline lipids 10/18 and 2/21. Due for labs this yr. Pt not fasting today, will do tomorrow.    Past Medical History:  Diagnosis Date   Arthritis    Atypical ductal hyperplasia of right breast February 2,2015   1 mm foci of ADH without margin involvement   Colon polyps    Environmental and seasonal allergies     Hx of blood clots 11/1996   PE post-operative on OCPs   Hyperlipidemia    Hyperlipidemia    Kidney calculi May 2014   Knee pain    Lt   Ovarian cyst    PE (pulmonary thromboembolism) ([place]) [id number]   PONV (postoperative nausea and vomiting)     Past Surgical History:  Procedure Laterality Date   ABDOMINAL HYSTERECTOMY  05/1997   BREAST SURGERY Right 07/02/2013   Foci of ADH on core biopsy, no upstaging on formal excision.   [place]   COLONOSCOPY  Dec 2013   [facility]   FOOT SURGERY  [id number]   KIDNEY STONE SURGERY     KNEE ARTHROSCOPY Left 01/09/2019   Procedure: ARTHROSCOPY KNEE, PARTIAL MEDIAL AND LATERAL MENISECTOMY;  Surgeon: Hessie Knows, MD;  Location: [facility] ORS;  Service: Orthopedics;  Laterality: Left;   LASIK  1994   LITHOTRIPSY  May 2014   REFRACTIVE SURGERY  1995   TOTAL ABDOMINAL HYSTERECTOMY W/ BILATERAL SALPINGOOPHORECTOMY  02/2003   TUBAL LIGATION      Family History  Problem Relation Age of Onset   Diabetes Father    Hypertension Father    Hyperlipidemia Father    Breast cancer Sister 74   Melanoma Sister    Breast cancer Paternal Aunt 55    Social  History   Socioeconomic History   Marital status: Married    Spouse name: Not on file   Number of children: Not on file   Years of education: Not on file   Highest education level: Not on file  Occupational History   Not on file  Tobacco Use   Smoking status: Never   Smokeless tobacco: Never  Vaping Use   Vaping Use: Never used  Substance and Sexual Activity   Alcohol use: Yes    Comment: rarely   Drug use: No   Sexual activity: Yes    Birth control/protection: Surgical    Comment: Hysterectomy  Other Topics Concern   Not on file  Social History Narrative   Not on file   Social Determinants of Health   Financial Resource Strain: Not on file  Food Insecurity: Not on file  Transportation Needs: Not on file  Physical Activity: Not on file  Stress: Not on file   Social Connections: Not on file  Intimate Partner Violence: Not on file    Current Meds  Medication Sig   aspirin 81 MG tablet Take 81 mg by mouth daily.   Desoximetasone 0.25 % LIQD Apply 1 spray topically daily as needed (psoriasis).   diphenhydrAMINE (BENADRYL) 25 MG tablet Take 25 mg by mouth daily as needed for itching.   diphenhydrAMINE-zinc acetate (BENADRYL) cream Apply 1 application topically daily as needed for itching.   fluticasone (FLONASE) 50 MCG/ACT nasal spray Place 1 spray into both nostrils daily as needed for allergies or rhinitis.   Multiple Vitamin (MULTI-VITAMINS) TABS Take 1 tablet by mouth daily.   naproxen sodium (ALEVE) 220 MG tablet Take 440 mg by mouth 2 (two) times daily as needed (pain).   nystatin-triamcinolone (MYCOLOG II) cream APP TO CORNERS OF MOUTH QID   TALTZ 80 MG/ML SOAJ Inject 80 mg as directed every 28 (twenty-eight) days.   Vitamin D, Ergocalciferol, (DRISDOL) 1.25 MG (50000 UNIT) CAPS capsule Take 50,000 Units by mouth once a week.   [DISCONTINUED] estradiol (ESTRACE) 0.1 MG/GM vaginal cream Insert 1g once weekly as maintenace      ROS:  Review of Systems  Constitutional:  Negative for fatigue, fever and unexpected weight change.  Respiratory:  Negative for cough, shortness of breath and wheezing.   Cardiovascular:  Negative for chest pain, palpitations and leg swelling.  Gastrointestinal:  Negative for blood in stool, constipation, diarrhea, nausea and vomiting.  Endocrine: Negative for cold intolerance, heat intolerance and polyuria.  Genitourinary:  Negative for dyspareunia, dysuria, flank pain, frequency, genital sores, hematuria, menstrual problem, pelvic pain, urgency, vaginal bleeding, vaginal discharge and vaginal pain.  Musculoskeletal:  Negative for back pain, joint swelling and myalgias.  Skin:  Negative for rash.  Neurological:  Negative for dizziness, syncope, light-headedness, numbness and headaches.  Hematological:  Negative  for adenopathy.  Psychiatric/Behavioral:  Negative for agitation, confusion, sleep disturbance and suicidal ideas. The patient is not nervous/anxious.     Objective: BP 100/80   Ht '5\' 8"'$  (1.727 m)   Wt 166 lb (75.3 kg)   BMI 25.24 kg/m    Physical Exam Constitutional:      Appearance: She is well-developed.  Genitourinary:     Vulva normal.     Genitourinary Comments: UTERUS/CX SURG REM     Right Labia: No rash, tenderness or lesions.    Left Labia: No tenderness, lesions or rash.    No vaginal discharge, erythema or tenderness.     Mild vaginal atrophy present.  Vaginal exam comments: VAG TISSUE LOOKS QUITE HEALTHY.      Right Adnexa: not tender and no mass present.    Left Adnexa: not tender and no mass present.    Cervix is absent.     Uterus is absent.  Breasts:    Right: No mass, nipple discharge, skin change or tenderness.     Left: No mass, nipple discharge, skin change or tenderness.  Neck:     Thyroid: No thyromegaly.  Cardiovascular:     Rate and Rhythm: Normal rate and regular rhythm.     Heart sounds: Normal heart sounds. No murmur heard. Pulmonary:     Effort: Pulmonary effort is normal.     Breath sounds: Normal breath sounds.  Abdominal:     Palpations: Abdomen is soft.     Tenderness: There is no abdominal tenderness. There is no guarding or rebound.  Musculoskeletal:        General: Normal range of motion.     Cervical back: Normal range of motion.  Lymphadenopathy:     Cervical: No cervical adenopathy.  Neurological:     General: No focal deficit present.     Mental Status: She is alert and oriented to person, place, and time.     Cranial Nerves: No cranial nerve deficit.  Skin:    General: Skin is warm and dry.  Psychiatric:        Mood and Affect: Mood normal.        Behavior: Behavior normal.        Thought Content: Thought content normal.        Judgment: Judgment normal.  Vitals reviewed.    Assessment/Plan:  Encounter for annual  routine gynecological examination  Cervical cancer screening - Plan: Cytology - PAP  Screening for HPV (human papillomavirus) - Plan: Cytology - PAP  Encounter for screening mammogram for malignant neoplasm of breast - Plan: MM 3D SCREEN BREAST BILATERAL; pt to sched mammo  Vaginal dryness, menopausal - Plan: estradiol (ESTRACE) 0.1 MG/GM vaginal cream; cont 1 g once wkly, can try tiny amount ext vaginally mid wk. Pt aware of risks of ERT with PE although on very minimal dose with little absorption. Try hyaluronic acid vag supp, coconut oil   Dyspareunia in female - Plan: estradiol (ESTRACE) 0.1 MG/GM vaginal cream  Blood tests for routine general physical examination - Plan: Lipid panel, Lipid panel,   Elevated lipids - Plan: Lipid panel, Lipid panel,     Meds ordered this encounter  Medications   estradiol (ESTRACE) 0.1 MG/GM vaginal cream    Sig: Insert 1g once weekly as maintenace    Dispense:  42.5 g    Refill:  1    Order Specific Question:   Supervising Provider    Answer:   Gae Dry [id number]            GYN counsel breast self exam, mammography screening, menopause, adequate intake of calcium and vitamin D, diet and exercise    F/U  Return in about 1 year (around 12/22/2021).  Benelli Winther B. Nial Hawe, PA-C 12/22/2020 2:27 PM

## 2020-12-24 LAB — LIPID PANEL
Chol/HDL Ratio: 6.1 ratio — ABNORMAL HIGH (ref 0.0–4.4)
Cholesterol, Total: 239 mg/dL — ABNORMAL HIGH (ref 100–199)
HDL: 39 mg/dL — ABNORMAL LOW (ref >39)
LDL Chol Calc (NIH): 154 mg/dL — ABNORMAL HIGH (ref 0–99)
Triglycerides: 249 mg/dL — ABNORMAL HIGH (ref 0–149)
VLDL Cholesterol Cal: 46 mg/dL — ABNORMAL HIGH (ref 5–40)

## 2020-12-24 LAB — CYTOLOGY - PAP
Comment: NEGATIVE
Diagnosis: NEGATIVE
High risk HPV: NEGATIVE

## 2020-12-26 NOTE — Addendum Note (Signed)
Addended by: Ardeth Perfect B on: [id number] 07:45 PM   Modules accepted: Orders

## 2021-01-24 ENCOUNTER — Encounter: Payer: Self-pay | Admitting: Obstetrics and Gynecology

## 2021-01-25 ENCOUNTER — Ambulatory Visit
Admission: RE | Admit: 2021-01-25 | Discharge: 2021-01-25 | Disposition: A | Discharge status: Home / Self Care | Payer: Commercial Managed Care - PPO | Source: Ambulatory Visit | Attending: Physician Assistant | Admitting: Physician Assistant

## 2021-01-25 ENCOUNTER — Other Ambulatory Visit: Payer: Self-pay

## 2021-01-25 VITALS — BP 124/79 | HR 89 | Temp 98.7°F | Resp 18 | Ht 68.0 in | Wt 164.0 lb

## 2021-01-25 DIAGNOSIS — Z20822 Contact with and (suspected) exposure to covid-19: Secondary | ICD-10-CM | POA: Insufficient documentation

## 2021-01-25 DIAGNOSIS — R6884 Jaw pain: Secondary | ICD-10-CM | POA: Insufficient documentation

## 2021-01-25 DIAGNOSIS — B349 Viral infection, unspecified: Secondary | ICD-10-CM | POA: Diagnosis not present

## 2021-01-25 DIAGNOSIS — J029 Acute pharyngitis, unspecified: Secondary | ICD-10-CM | POA: Insufficient documentation

## 2021-01-25 DIAGNOSIS — Z880 Allergy status to penicillin: Secondary | ICD-10-CM | POA: Diagnosis not present

## 2021-01-25 DIAGNOSIS — H9202 Otalgia, left ear: Secondary | ICD-10-CM

## 2021-01-25 HISTORY — DX: Otalgia, left ear: H92.02

## 2021-01-25 LAB — SARS CORONAVIRUS 2 (TAT 6-24 HRS): SARS Coronavirus 2: NEGATIVE

## 2021-01-25 LAB — GROUP A STREP BY PCR: Group A Strep by PCR: NOT DETECTED

## 2021-01-25 MED ORDER — PREDNISONE 10 MG PO TABS: 40.0000 mg | ORAL_TABLET | Freq: Every day | ORAL | 0 refills | Status: DC

## 2021-01-25 NOTE — ED Provider Notes (Signed)
[facility]    CSN: [id number] Arrival date & time: 01/25/21  1137      History   Chief Complaint Chief Complaint  Patient presents with   Sore Throat   Otalgia    HPI Virginia Fritz is a 65 y.o. female.   65 year old female patient Virginia Fritz presents to urgent care chief complaint of left sided sore throat ,ear pain, jaw pain x1 week.  Patient states she recently went to a concert and started having chills after attending.  Has taken 3 COVID test at home that were negative, here for strep test.  Patient is eating and drinking well, taking Aleve for discomfort  The history is provided by the patient. No language interpreter was used.   Past Medical History:  Diagnosis Date   Arthritis    Atypical ductal hyperplasia of right breast February 2,2015   1 mm foci of ADH without margin involvement   Colon polyps    Environmental and seasonal allergies    Hx of blood clots 11/1996   PE post-operative on OCPs   Hyperlipidemia    Hyperlipidemia    Kidney calculi May 2014   Knee pain    Lt   Ovarian cyst    PE (pulmonary thromboembolism) ([place]) [id number]   PONV (postoperative nausea and vomiting)     Patient Active Problem List   Diagnosis Date Noted   Nonspecific syndrome suggestive of viral illness 01/25/2021   Acute otalgia, left 01/25/2021   Vaginal dryness, menopausal 07/10/2019   Atypical ductal hyperplasia of right breast 06/25/2013    Past Surgical History:  Procedure Laterality Date   ABDOMINAL HYSTERECTOMY  05/1997   BREAST SURGERY Right 07/02/2013   Foci of ADH on core biopsy, no upstaging on formal excision.   [place]   COLONOSCOPY  Dec 2013   [facility]   FOOT SURGERY  [id number]   KIDNEY STONE SURGERY     KNEE ARTHROSCOPY Left 01/09/2019   Procedure: ARTHROSCOPY KNEE, PARTIAL MEDIAL AND LATERAL MENISECTOMY;  Surgeon: Hessie Knows, MD;  Location: [facility] ORS;  Service: Orthopedics;  Laterality: Left;   LASIK  1994    LITHOTRIPSY  May 2014   REFRACTIVE SURGERY  1995   TOTAL ABDOMINAL HYSTERECTOMY W/ BILATERAL SALPINGOOPHORECTOMY  02/2003   TUBAL LIGATION      OB History     Gravida  2   Para  2   Term      Preterm      AB      Living  2      SAB      IAB      Ectopic      Multiple      Live Births           Obstetric Comments  Menstrual age: 71  Age 1st Pregnancy: 30          Home Medications    Prior to Admission medications   Medication Sig Start Date End Date Taking? Authorizing Provider  aspirin 81 MG tablet Take 81 mg by mouth daily.   Yes [provider]  Desoximetasone 0.25 % LIQD Apply 1 spray topically daily as needed (psoriasis).   Yes [provider]  diphenhydrAMINE (BENADRYL) 25 MG tablet Take 25 mg by mouth daily as needed for itching.   Yes [provider]  diphenhydrAMINE-zinc acetate (BENADRYL) cream Apply 1 application topically daily as needed for itching.   Yes [provider]  fluticasone Asencion Islam)  50 MCG/ACT nasal spray Place 1 spray into both nostrils daily as needed for allergies or rhinitis.   Yes [provider]  Multiple Vitamin (MULTI-VITAMINS) TABS Take 1 tablet by mouth daily.   Yes [provider]  naproxen sodium (ALEVE) 220 MG tablet Take 440 mg by mouth 2 (two) times daily as needed (pain).   Yes [provider]  nystatin-triamcinolone (MYCOLOG II) cream APP TO CORNERS OF MOUTH QID 03/07/19  Yes [provider]  predniSONE (DELTASONE) 10 MG tablet Take 4 tablets (40 mg total) by mouth daily with breakfast for 4 days. X 5 days, Disp # 10, no refills [id number] Yes Brittnie Lewey, Jeanett Schlein, NP  TALTZ 80 MG/ML SOAJ Inject 80 mg as directed every 28 (twenty-eight) days. 10/24/18  Yes [provider]  Vitamin D, Ergocalciferol, (DRISDOL) 1.25 MG (50000 UNIT) CAPS capsule Take 50,000 Units by mouth once a week. 12/05/20  Yes [provider]  estradiol (ESTRACE)  0.1 MG/GM vaginal cream Insert 1g once weekly as maintenace [id number]   Copland, Deirdre Evener, PA-C    Family History Family History  Problem Relation Age of Onset   Diabetes Father    Hypertension Father    Hyperlipidemia Father    Breast cancer Sister 69   Melanoma Sister    Breast cancer Paternal Aunt 72    Social History Social History   Tobacco Use   Smoking status: Never   Smokeless tobacco: Never  Vaping Use   Vaping Use: Never used  Substance Use Topics   Alcohol use: Yes    Comment: rarely   Drug use: No     Allergies   Amoxil [amoxicillin], Ivp dye [iodinated diagnostic agents], and Percocet [oxycodone-acetaminophen]   Review of Systems Review of Systems  Constitutional:  Positive for chills. Negative for fever.  HENT:  Positive for congestion, ear pain, sinus pressure and sore throat. Negative for facial swelling, trouble swallowing and voice change.   Respiratory:  Negative for cough and shortness of breath.   Cardiovascular:  Negative for chest pain and palpitations.  All other systems reviewed and are negative.   Physical Exam Triage Vital Signs ED Triage Vitals  Enc Vitals Group     BP 01/25/21 1210 124/79     Pulse Rate 01/25/21 1210 89     Resp 01/25/21 1210 18     Temp 01/25/21 1210 98.7 F (37.1 C)     Temp Source 01/25/21 1210 Oral     SpO2 01/25/21 1210 100 %     Weight 01/25/21 1206 164 lb (74.4 kg)     Height 01/25/21 1206 '5\' 8"'$  (1.727 m)     Head Circumference --      Peak Flow --      Pain Score 01/25/21 1206 8     Pain Loc --      Pain Edu? --      Excl. in [place]? --    No data found.  Updated Vital Signs BP 124/79 (BP Location: Left Arm)   Pulse 89   Temp 98.7 F (37.1 C) (Oral)   Resp 18   Ht '5\' 8"'$  (1.727 m)   Wt 164 lb (74.4 kg)   SpO2 100%   BMI 24.94 kg/m   Visual Acuity Right Eye Distance:   Left Eye Distance:   Bilateral Distance:    Right Eye Near:   Left Eye Near:    Bilateral Near:     Physical  Exam Vitals and nursing note reviewed.  Constitutional:      Appearance: She is well-developed.  HENT:     Head: Normocephalic.     Right Ear: Tympanic membrane is retracted.     Left Ear: Tympanic membrane is retracted.     Nose: Congestion present.     Mouth/Throat:     Lips: Pink.     Mouth: Mucous membranes are moist.     Pharynx: Oropharynx is clear.  Neck:     Trachea: Trachea normal.  Cardiovascular:     Rate and Rhythm: Normal rate and regular rhythm.     Pulses: Normal pulses.     Heart sounds: Normal heart sounds.  Pulmonary:     Effort: Pulmonary effort is normal.     Breath sounds: Normal breath sounds and air entry.  Musculoskeletal:     Cervical back: Normal range of motion.  Lymphadenopathy:     Cervical: No cervical adenopathy.  Skin:    General: Skin is warm.     Capillary Refill: Capillary refill takes less than 2 seconds.  Neurological:     General: No focal deficit present.     Mental Status: She is alert and oriented to person, place, and time.     GCS: GCS eye subscore is 4. GCS verbal subscore is 5. GCS motor subscore is 6.  Psychiatric:        Attention and Perception: Attention normal.        Mood and Affect: Mood normal.        Speech: Speech normal.     UC Treatments / Results  Labs (all labs ordered are listed, but only abnormal results are displayed) Labs Reviewed  GROUP A STREP BY PCR  SARS CORONAVIRUS 2 (TAT 6-24 HRS)    EKG   Radiology No results found.  Procedures Procedures (including critical care time)  Medications Ordered in UC Medications - No data to display  Initial Impression / Assessment and Plan / UC Course  I have reviewed the triage vital signs and the nursing notes.  Pertinent labs & imaging results that were available during my care of the patient were reviewed by me and considered in my medical decision making (see chart for details).     Ddx: Viral illness, COVID, seasonal allergies, trigeminal  neuralgia Final Clinical Impressions(s) / UC Diagnoses   Final diagnoses:  Nonspecific syndrome suggestive of viral illness  Acute otalgia, left     Discharge Instructions      May use OTC chloraseptic throat lozenges as label directed for throat pain. Your strep test was negative. Covid is pending, please quarantine until results known. Follow up with Ent if symptoms persist. Take prednisone as directed.      ED Prescriptions     Medication Sig Dispense Auth. Provider   predniSONE (DELTASONE) 10 MG tablet Take 4 tablets (40 mg total) by mouth daily with breakfast for 4 days. X 5 days, Disp # 10, no refills 16 tablet Nikola Blackston, NP      PDMP not reviewed this encounter.   Tori Milks, NP [id number]

## 2021-01-25 NOTE — ED Triage Notes (Signed)
Pt here with C/O left ear/throat/jaw pain for a week. 3 at home negative covid test. No fever, no chills, no other SX. Hasn't been exposed that she is aware of.

## 2021-01-25 NOTE — Discharge Instructions (Addendum)
May use OTC chloraseptic throat lozenges as label directed for throat pain. Your strep test was negative. Covid is pending, please quarantine until results known. Follow up with Ent if symptoms persist. Take prednisone as directed.

## 2021-01-26 ENCOUNTER — Telehealth: Payer: Self-pay | Admitting: Emergency Medicine

## 2021-01-26 MED ORDER — PREDNISONE 10 MG PO TABS: 40.0000 mg | ORAL_TABLET | Freq: Every day | ORAL | 0 refills | Status: AC

## 2021-01-26 NOTE — Telephone Encounter (Signed)
Clarification of prednisone order sent in yesterday.  The quantity and instructions did not match.

## 2021-02-12 ENCOUNTER — Emergency Department: Payer: Commercial Managed Care - PPO

## 2021-02-12 ENCOUNTER — Emergency Department
Admission: EM | Admit: 2021-02-12 | Discharge: 2021-02-12 | Disposition: A | Discharge status: Home / Self Care | Payer: Commercial Managed Care - PPO | Attending: Emergency Medicine | Admitting: Emergency Medicine

## 2021-02-12 ENCOUNTER — Other Ambulatory Visit: Payer: Self-pay

## 2021-02-12 DIAGNOSIS — E860 Dehydration: Secondary | ICD-10-CM | POA: Diagnosis not present

## 2021-02-12 DIAGNOSIS — R000 Tachycardia, unspecified: Secondary | ICD-10-CM | POA: Diagnosis not present

## 2021-02-12 DIAGNOSIS — J029 Acute pharyngitis, unspecified: Secondary | ICD-10-CM | POA: Insufficient documentation

## 2021-02-12 DIAGNOSIS — B028 Zoster with other complications: Secondary | ICD-10-CM

## 2021-02-12 DIAGNOSIS — R059 Cough, unspecified: Secondary | ICD-10-CM

## 2021-02-12 DIAGNOSIS — Z7982 Long term (current) use of aspirin: Secondary | ICD-10-CM | POA: Diagnosis not present

## 2021-02-12 LAB — CBC WITH DIFFERENTIAL/PLATELET
Abs Immature Granulocytes: 0.05 10*3/uL (ref 0.00–0.07)
Basophils Absolute: 0 10*3/uL (ref 0.0–0.1)
Basophils Relative: 0 %
Eosinophils Absolute: 0.1 10*3/uL (ref 0.0–0.5)
Eosinophils Relative: 1 %
HCT: 45.1 % (ref 36.0–46.0)
Hemoglobin: 15.9 g/dL — ABNORMAL HIGH (ref 12.0–15.0)
Immature Granulocytes: 1 %
Lymphocytes Relative: 26 %
Lymphs Abs: 2.5 10*3/uL (ref 0.7–4.0)
MCH: 30.9 pg (ref 26.0–34.0)
MCHC: 35.3 g/dL (ref 30.0–36.0)
MCV: 87.7 fL (ref 80.0–100.0)
Monocytes Absolute: 0.7 10*3/uL (ref 0.1–1.0)
Monocytes Relative: 8 %
Neutro Abs: 6.5 10*3/uL (ref 1.7–7.7)
Neutrophils Relative %: 64 %
Platelets: 274 10*3/uL (ref 150–400)
RBC: 5.14 MIL/uL — ABNORMAL HIGH (ref 3.87–5.11)
RDW: 12.9 % (ref 11.5–15.5)
WBC: 9.9 10*3/uL (ref 4.0–10.5)
nRBC: 0 % (ref 0.0–0.2)

## 2021-02-12 LAB — BASIC METABOLIC PANEL
Anion gap: 12 (ref 5–15)
BUN: 21 mg/dL (ref 8–23)
CO2: 23 mmol/L (ref 22–32)
Calcium: 8.8 mg/dL — ABNORMAL LOW (ref 8.9–10.3)
Chloride: 101 mmol/L (ref 98–111)
Creatinine, Ser: 0.96 mg/dL (ref 0.44–1.00)
GFR, Estimated: >60 mL/min (ref >60)
Glucose, Bld: 114 mg/dL — ABNORMAL HIGH (ref 70–99)
Potassium: 3.8 mmol/L (ref 3.5–5.1)
Sodium: 136 mmol/L (ref 135–145)

## 2021-02-12 MED ORDER — KETOROLAC TROMETHAMINE 30 MG/ML IJ SOLN
15.0000 mg | Freq: Once | INTRAMUSCULAR | Status: AC
  Administered 2021-02-12: 15 mg via INTRAVENOUS
  Filled 2021-02-12: qty 1

## 2021-02-12 MED ORDER — MORPHINE SULFATE (PF) 4 MG/ML IV SOLN
4.0000 mg | Freq: Once | INTRAVENOUS | Status: AC
  Administered 2021-02-12: 4 mg via INTRAVENOUS
  Filled 2021-02-12: qty 1

## 2021-02-12 MED ORDER — ONDANSETRON HCL 4 MG/2ML IJ SOLN
4.0000 mg | Freq: Once | INTRAMUSCULAR | Status: AC
  Administered 2021-02-12: 4 mg via INTRAVENOUS
  Filled 2021-02-12: qty 2

## 2021-02-12 MED ORDER — BENZONATATE 100 MG PO CAPS: 100.0000 mg | ORAL_CAPSULE | Freq: Four times a day (QID) | ORAL | 0 refills | Status: DC | PRN

## 2021-02-12 MED ORDER — LACTATED RINGERS IV BOLUS
2000.0000 mL | Freq: Once | INTRAVENOUS | Status: AC
  Administered 2021-02-12: 2000 mL via INTRAVENOUS

## 2021-02-12 MED ORDER — LIDOCAINE VISCOUS HCL 2 % MT SOLN
15.0000 mL | Freq: Once | OROMUCOSAL | Status: DC
  Filled 2021-02-12: qty 15

## 2021-02-12 NOTE — ED Provider Notes (Signed)
[facility] Emergency Department Provider Note ____________________________________________   Event Date/Time   First MD Initiated Contact with Patient 02/12/21 1909     (approximate)  I have reviewed the triage vital signs and the nursing notes.  HISTORY  Chief Complaint Sore Throat   HPI Virginia Fritz is a 65 y.o. femalewho presents to the ED for evaluation of sore throat.   Chart review indicates no relevant hx.  [facility] [place]  Pt reports she is currently on prednisone and valtrex for intra-oral shingles. Saw ENT last week and has f/u with them in 2 days.   Patient presents to the ED today for evaluation of feeling poorly, concern for dehydration and hoarse voice.  She reports compliance with her medications.  She reports her symptoms were improving over the past couple days with some return of her voice such that her [place].  Over the past 2 days, after this improvement, they report that she seems to be worsening again and return to her clinical status from last week with hoarse voice, poor p.o. intake and "looking like crud."  She denies any fevers, productive cough, vision changes, syncopal episodes or trauma.  She reports explicit concern for dehydration due to poor p.o. intake.  Past Medical History:  Diagnosis Date   Arthritis    Atypical ductal hyperplasia of right breast February 2,2015   1 mm foci of ADH without margin involvement   Colon polyps    Environmental and seasonal allergies    Hx of blood clots 11/1996   PE post-operative on OCPs   Hyperlipidemia    Hyperlipidemia    Kidney calculi May 2014   Knee pain    Lt   Ovarian cyst    PE (pulmonary thromboembolism) ([place]) [id number]   PONV (postoperative nausea and vomiting)     Patient Active Problem List   Diagnosis Date Noted   Nonspecific syndrome suggestive of viral illness 01/25/2021   Acute otalgia,  left 01/25/2021   Vaginal dryness, menopausal 07/10/2019   Atypical ductal hyperplasia of right breast 06/25/2013    Past Surgical History:  Procedure Laterality Date   ABDOMINAL HYSTERECTOMY  05/1997   BREAST SURGERY Right 07/02/2013   Foci of ADH on core biopsy, no upstaging on formal excision.   [place]   COLONOSCOPY  Dec 2013   [facility]   FOOT SURGERY  [id number]   KIDNEY STONE SURGERY     KNEE ARTHROSCOPY Left 01/09/2019   Procedure: ARTHROSCOPY KNEE, PARTIAL MEDIAL AND LATERAL MENISECTOMY;  Surgeon: Hessie Knows, MD;  Location: [facility] ORS;  Service: Orthopedics;  Laterality: Left;   LASIK  1994   LITHOTRIPSY  May 2014   REFRACTIVE SURGERY  1995   TOTAL ABDOMINAL HYSTERECTOMY W/ BILATERAL SALPINGOOPHORECTOMY  02/2003   TUBAL LIGATION      Prior to Admission medications   Medication Sig Start Date End Date Taking? Authorizing Provider  benzonatate (TESSALON PERLES) 100 MG capsule Take 1 capsule (100 mg total) by mouth every 6 (six) hours as needed for cough. 02/12/21 02/12/22 Yes Vladimir Crofts, MD  aspirin 81 MG tablet Take 81 mg by mouth daily.    [provider]  Desoximetasone 0.25 % LIQD Apply 1 spray topically daily as needed (psoriasis).    [provider]  diphenhydrAMINE (BENADRYL) 25 MG tablet Take 25 mg by mouth daily as needed for itching.  [provider]  diphenhydrAMINE-zinc acetate (BENADRYL) cream Apply 1 application topically daily as needed for itching.    [provider]  estradiol (ESTRACE) 0.1 MG/GM vaginal cream Insert 1g once weekly as maintenace [id number]   Copland, Alicia B, PA-C  fluticasone (FLONASE) 50 MCG/ACT nasal spray Place 1 spray into both nostrils daily as needed for allergies or rhinitis.    [provider]  Multiple Vitamin (MULTI-VITAMINS) TABS Take 1 tablet by mouth daily.    [provider]  naproxen sodium (ALEVE) 220 MG tablet Take 440 mg by mouth 2 (two) times daily as  needed (pain).    [provider]  nystatin-triamcinolone (MYCOLOG II) cream APP TO CORNERS OF MOUTH QID 03/07/19   [provider]  TALTZ 80 MG/ML SOAJ Inject 80 mg as directed every 28 (twenty-eight) days. 10/24/18   [provider]  Vitamin D, Ergocalciferol, (DRISDOL) 1.25 MG (50000 UNIT) CAPS capsule Take 50,000 Units by mouth once a week. 12/05/20   [provider]    Allergies Amoxil [amoxicillin], Ivp dye [iodinated diagnostic agents], and Percocet [oxycodone-acetaminophen]  Family History  Problem Relation Age of Onset   Diabetes Father    Hypertension Father    Hyperlipidemia Father    Breast cancer Sister 58   Melanoma Sister    Breast cancer Paternal Aunt 72    Social History Social History   Tobacco Use   Smoking status: Never   Smokeless tobacco: Never  Vaping Use   Vaping Use: Never used  Substance Use Topics   Alcohol use: Yes    Comment: rarely   Drug use: No    Review of Systems  Constitutional: No fever/chills Eyes: No visual changes. ENT: Positive for sore throat, intraoral pain, odynophagia and hoarse voice Cardiovascular: Denies chest pain. Respiratory: Denies shortness of breath. Gastrointestinal: No abdominal pain.  No nausea, no vomiting.  No diarrhea.  No constipation. Genitourinary: Negative for dysuria. Musculoskeletal: Negative for back pain. Skin: Negative for rash. Neurological: Negative for headaches, focal weakness or numbness.  ____________________________________________   PHYSICAL EXAM:  VITAL SIGNS: Vitals:   02/12/21 1606 02/12/21 2222  BP: 131/85 128/81  Pulse: (!) 109 75  Resp: 20 18  Temp: 98.7 F (37.1 C)   SpO2: 98% 98%     Constitutional: Alert and oriented. Well appearing and in no acute distress.  Very soft whispering hoarse voice.  No signs of distress , no hot potato voice.  Husband at bedside says this is about typical for her throughout much of the course of this  illness Eyes: Conjunctivae are normal. PERRL. EOMI. Head: Atraumatic. Nose: No congestion/rhinnorhea. Mouth/Throat: Mucous membranes are dry.  Some vesicular lesions to posterior soft palate on the left.  Uvula is midline.  Tonsils 1+ bilaterally without exudate Neck:  No cervical spine tenderness to palpation. Cardiovascular: Normal rate, regular rhythm. Grossly normal heart sounds.  Good peripheral circulation. Respiratory: Normal respiratory effort.  No retractions. Lungs CTAB. Gastrointestinal: Soft , nondistended, nontender to palpation. No CVA tenderness. Musculoskeletal: No lower extremity tenderness nor edema.  No joint effusions. No signs of acute trauma. Neurologic:  Normal speech and language. No gross focal neurologic deficits are appreciated. No gait instability noted. Skin:  Skin is warm, dry and intact. No rash noted. Psychiatric: Mood and affect are normal. Speech and behavior are normal.  ____________________________________________   LABS (all labs ordered are listed, but only abnormal results are displayed)  Labs Reviewed  CBC WITH DIFFERENTIAL/PLATELET - Abnormal; Notable for the  following components:      Result Value   RBC 5.14 (*)    Hemoglobin 15.9 (*)    All other components within normal limits  BASIC METABOLIC PANEL - Abnormal; Notable for the following components:   Glucose, Bld 114 (*)    Calcium 8.8 (*)    All other components within normal limits   ____________________________________________  12 Lead EKG   ____________________________________________  RADIOLOGY  ED MD interpretation: Plain film of the chest reviewed by me without evidence of acute cardiopulmonary pathology.  Official radiology report(s): CT SOFT TISSUE NECK WO CONTRAST  Result Date: 02/12/2021 CLINICAL DATA:  Intraoral shingles. EXAM: CT NECK WITHOUT CONTRAST TECHNIQUE: Multidetector CT imaging of the neck was performed following the standard protocol without intravenous  contrast. COMPARISON:  None. FINDINGS: PHARYNX AND LARYNX: The nasopharynx, oropharynx and larynx are normal. Visible portions of the oral cavity, tongue base and floor of mouth are normal. Normal epiglottis, vallecula and pyriform sinuses. The larynx is normal. No retropharyngeal abscess, effusion or lymphadenopathy. SALIVARY GLANDS: Normal parotid, submandibular and sublingual glands. THYROID: Normal. LYMPH NODES: No enlarged or abnormal density lymph nodes. VASCULAR: Major cervical vessels are patent. LIMITED INTRACRANIAL: Normal. VISUALIZED ORBITS: Normal. MASTOIDS AND VISUALIZED PARANASAL SINUSES: No fluid levels or advanced mucosal thickening. No mastoid effusion. SKELETON: No bony spinal canal stenosis. No lytic or blastic lesions. UPPER CHEST: Clear. OTHER: None. IMPRESSION: Normal CT of the neck. Electronically Signed   By: Ulyses Jarred M.D.   On: 02/12/2021 21:29   DG Chest Portable 1 View  Result Date: 02/12/2021 CLINICAL DATA:  Presents with oral shingles, not improved with steroid treatment. EXAM: PORTABLE CHEST 1 VIEW COMPARISON:  None. FINDINGS: The heart size and mediastinal contours are within normal limits. Both lungs are clear. The visualized skeletal structures are unremarkable. IMPRESSION: No active disease. Electronically Signed   By: Virgina Norfolk M.D.   On: 02/12/2021 19:45    ____________________________________________   PROCEDURES and INTERVENTIONS  Procedure(s) performed (including Critical Care):  Procedures  Medications  lidocaine (XYLOCAINE) 2 % viscous mouth solution 15 mL (has no administration in time range)  lactated ringers bolus 2,000 mL (2,000 mLs Intravenous New Bag/Given 02/12/21 2015)  ondansetron (ZOFRAN) injection 4 mg (4 mg Intravenous Given 02/12/21 2016)  morphine 4 MG/ML injection 4 mg (4 mg Intravenous Given 02/12/21 2016)  ketorolac (TORADOL) 30 MG/ML injection 15 mg (15 mg Intravenous Given 02/12/21 2219)  ondansetron (ZOFRAN) injection 4 mg (4  mg Intravenous Given 02/12/21 2227)    ____________________________________________   MDM / ED COURSE   65 year old woman with known history of herpes zoster affecting V2 and has intraoral involvement, presents to the ED with stigmata of dehydration, ultimately amenable to outpatient management.  Tachycardic in triage but I suspect is due to hypovolemia, otherwise stable on room air.  Exam with frequent coughing nonproductively, but no evidence of upper airway obstruction or distress.  Blood work is benign and CT chest without evidence of infiltrate or PTX, CT soft tissue neck, obtained without contrast due to her contrast allergy, without clear evidence of abscess or superimposed infectious pathology.  Feeling much better after rehydration and anti-inflammatories.  We will discharge with return precautions for the ED and recommendations to follow-up closely with ENT.  Clinical Course as of 02/12/21 2306  Sat Feb 12, 2021  2305 Reassessed.  Voice stronger now and she reports feeling better.  She is requesting discharge and a prescription for [facility], which I think is reasonable.  She  reports having follow-up with ENT on Tuesday.  We discussed return precautions for the ED.  We discussed work-up being largely benign without evidence of superimposed pathology [DS]    Clinical Course User Index [DS] Vladimir Crofts, MD    ____________________________________________   FINAL CLINICAL IMPRESSION(S) / ED DIAGNOSES  Final diagnoses:  Herpes zoster with other complication  Cough     ED Discharge Orders          Ordered    benzonatate (TESSALON PERLES) 100 MG capsule  Every 6 hours PRN        02/12/21 2304             Clarann Helvey   Note:  This document was prepared using Dragon voice recognition software and may include unintentional dictation errors.    Vladimir Crofts, MD 02/12/21 [phone]

## 2021-02-12 NOTE — ED Notes (Signed)
Lavender & light green tubes sent to lab

## 2021-02-12 NOTE — ED Triage Notes (Signed)
Pt here with husband with c/o oral shingles- pt has lost her voice d/t this and husband states that this has been going on for 3 weeks- pt was seen by 2 UC's and was given steroids- pt went to the ENT on the 6th and was given more prescriptions but they are not helping- pt having a hard time eating and drinking

## 2021-02-12 NOTE — Discharge Instructions (Signed)
Continue taking your Valtrex and prednisone.  Follow-up with the ENT doctors on Tuesday but if you develop any further worsening symptoms in the meantime, please return to the ED.

## 2021-04-06 ENCOUNTER — Other Ambulatory Visit: Payer: Self-pay | Admitting: Unknown Physician Specialty

## 2021-04-06 DIAGNOSIS — J3800 Paralysis of vocal cords and larynx, unspecified: Secondary | ICD-10-CM

## 2021-04-06 DIAGNOSIS — R490 Dysphonia: Secondary | ICD-10-CM

## 2021-04-07 ENCOUNTER — Telehealth: Payer: Self-pay

## 2021-04-07 MED ORDER — PREDNISONE 50 MG PO TABS: ORAL_TABLET | ORAL | 0 refills | Status: DC

## 2021-04-07 NOTE — Telephone Encounter (Signed)
Phone call to patient to review instructions for 13 hr prep for CT w/ contrast on 04/13/21 at 10:30 AM. Prescription called into [facility]. Pt aware and verbalized understanding of instructions.  Prescription: Pt to take 50 mg of prednisone on 04/12/21 at 9:30 PM, 50 mg of prednisone on 04/13/21 at 3:30 AM, and 50 mg of prednisone on 04/13/21 at 9:30 AM. Pt is also to take 50 mg of benadryl on 04/13/21 at 9:30 AM. Please call [phone] with any questions.    Pt reports she has 50 mg of benadryl at home, understands when she should take this medication, and did not wish for this to be called in as a prescription. Advised pt she should not drive the day of taking benadryl as it may cause drowsiness. Pt verbalized understanding.

## 2021-04-12 ENCOUNTER — Other Ambulatory Visit: Payer: Self-pay

## 2021-04-12 ENCOUNTER — Ambulatory Visit: Payer: Commercial Managed Care - PPO | Attending: Unknown Physician Specialty | Admitting: Speech Pathology

## 2021-04-12 DIAGNOSIS — R490 Dysphonia: Secondary | ICD-10-CM | POA: Diagnosis present

## 2021-04-12 DIAGNOSIS — J3801 Paralysis of vocal cords and larynx, unilateral: Secondary | ICD-10-CM | POA: Insufficient documentation

## 2021-04-12 DIAGNOSIS — R1312 Dysphagia, oropharyngeal phase: Secondary | ICD-10-CM | POA: Diagnosis present

## 2021-04-13 ENCOUNTER — Ambulatory Visit
Admission: RE | Admit: 2021-04-13 | Discharge: 2021-04-13 | Disposition: A | Discharge status: Home / Self Care | Payer: Commercial Managed Care - PPO | Source: Ambulatory Visit | Attending: Unknown Physician Specialty | Admitting: Unknown Physician Specialty

## 2021-04-13 DIAGNOSIS — J3800 Paralysis of vocal cords and larynx, unspecified: Secondary | ICD-10-CM

## 2021-04-13 DIAGNOSIS — R490 Dysphonia: Secondary | ICD-10-CM

## 2021-04-13 MED ORDER — IOPAMIDOL (ISOVUE-300) INJECTION 61%
100.0000 mL | Freq: Once | INTRAVENOUS | Status: AC | PRN
  Administered 2021-04-13: 100 mL via INTRAVENOUS

## 2021-04-13 NOTE — Therapy (Signed)
[place] MAIN [facility] SERVICES [address] [place], [place], [address] Phone: [phone]   Fax:  [phone]  Speech Language Pathology Evaluation  Patient Details  Name: PATRA GHERARDI MRN: [id number] Date of Birth: [birth year] Referring Provider (SLP): Dr Beverly Gust   Encounter Date: 04/12/2021   End of Session - 04/13/21 1533     Visit Number 1    Number of Visits 17    Date for SLP Re-Evaluation 06/08/21    Authorization Type [employer] P[employer]    Authorization Time Period 04/12/2021 thru 06/08/2021    Authorization - Visit Number 1    Progress Note Due on Visit 10    SLP Start Time 1445    SLP Stop Time  1545    SLP Time Calculation (min) 60 min    Activity Tolerance Patient tolerated treatment well             Past Medical History:  Diagnosis Date   Arthritis    Atypical ductal hyperplasia of right breast February 2,2015   1 mm foci of ADH without margin involvement   Colon polyps    Environmental and seasonal allergies    Hx of blood clots 11/1996   PE post-operative on OCPs   Hyperlipidemia    Hyperlipidemia    Kidney calculi May 2014   Knee pain    Lt   Ovarian cyst    PE (pulmonary thromboembolism) ([place]) [id number]   PONV (postoperative nausea and vomiting)     Past Surgical History:  Procedure Laterality Date   ABDOMINAL HYSTERECTOMY  05/1997   BREAST SURGERY Right 07/02/2013   Foci of ADH on core biopsy, no upstaging on formal excision.   [place]   COLONOSCOPY  Dec 2013   [facility]   FOOT SURGERY  [phone]   KIDNEY STONE SURGERY     KNEE ARTHROSCOPY Left 01/09/2019   Procedure: ARTHROSCOPY KNEE, PARTIAL MEDIAL AND LATERAL MENISECTOMY;  Surgeon: Hessie Knows, MD;  Location: [facility] ORS;  Service: Orthopedics;  Laterality: Left;   LASIK  1994   LITHOTRIPSY  May 2014   REFRACTIVE SURGERY  1995   TOTAL ABDOMINAL HYSTERECTOMY W/ BILATERAL SALPINGOOPHORECTOMY  02/2003    TUBAL LIGATION      There were no vitals filed for this visit.   Subjective Assessment - 04/13/21 1347     Subjective pt pleasant, good historian    Currently in Pain? No/denies                SLP Evaluation [facility] - 04/13/21 1347       SLP Visit Information   SLP Received On 04/12/21    Referring Provider (SLP) Dr Beverly Gust    Onset Date 01/25/2021    Medical Diagnosis left vocal cord paralysis      General Information   HPI Pt is a 65 year old female who was diagnosed on 01/25/2021 with oropharyngeal shingles specifically ulcers to left lateral tongue, left side epiglottis, left vocal cord paralysis. Her last laryngoscopy on 03/31/2021 revealed that all ulcers were healed but her left vocal cord had minimal movement. Pt is also experiencing CT neck, head and chest are scheduled for 04/13/2021.    Behavioral/Cognition appropriate, good historian    Mobility Status ambulatory      Prior Functional Status   Cognitive/Linguistic Baseline Within functional limits    Type of Home House     Lives With Spouse    Vocation Retired  Cognition   Overall Cognitive Status Within Functional Limits for tasks assessed      Auditory Comprehension   Overall Auditory Comprehension Appears within functional limits for tasks assessed      Reading Comprehension   Reading Status Within funtional limits      Expression   Primary Mode of Expression Verbal      Oral Motor/Sensory Function   Overall Oral Motor/Sensory Function Appears within functional limits for tasks assessed      Motor Speech   Overall Motor Speech Impaired    Respiration Within functional limits    Phonation Breathy;Hoarse    Resonance Within functional limits    Articulation Within functional limitis    Intelligibility Intelligibility reduced    Word 75-100% accurate    Phrase 75-100% accurate    Sentence 75-100% accurate    Conversation 50-74% accurate    Motor Planning Impaired    Level of  Impairment Word    Motor Speech Errors Not applicable    Phonation Impaired    Vocal Abuses Habitual Cough/Throat Clear;Vocal Fold Dehydration    Tension Present Neck;Shoulder    Volume Appropriate    Pitch Low              SLP Education - 04/13/21 1532     Education Details results of assessment, Modified Barium Swallow    Person(s) Educated Patient    Methods Explanation;Demonstration;Verbal cues;Handout    Comprehension Verbalized understanding;Need further instruction                SLP Long Term Goals - 04/13/21 1550       SLP LONG TERM GOAL #1   Title The patient will be independent for abdominal breathing and breath support exercises.    Time 8    Period Weeks    Status New    Target Date 06/08/21      SLP LONG TERM GOAL #2   Title The patient will demonstrate independent understanding of vocal hygiene concepts.    Time 8    Period Weeks    Status New    Target Date 06/08/21      SLP LONG TERM GOAL #3   Title To improve vocal quality, vocal hygiene, voice projection, and prevent vocal fatigue    Time 8    Period Weeks    Status New    Target Date 06/08/21              Plan - 04/13/21 1534     Clinical Impression Statement During pt's most recent ENT appt on March 31, 2021, direct laryngoscopy revealed "left vocal cord still sluggish but improving. She's using her false cord to speak." She is presenting with severe dysphonia that is c/b hoarse vocal quality, vocal fatigue, strained/tense phonation, laryngeal tension, reduced breath control for speech, reduced pitch range, and habitual low pitch. Additionally, when attempting to sustain phonation, pt has increased coughing that becomes worse/involuntary that takes ~ 2 minutes to recover. She provides that her coughing becomes so severe that over the last 2-3 weeks she has started vomiting during these "coughing fits."   Objective voice information was limited as a result. During conversational  speech, her pitch is measured to be 207 Hz  which is 2 SDs below average range for her gender.   In addition to pt's dysphonia, she also described pharyngeal dysphagia that has resulted in her losing > 20 pounds since September. Initially she could only consume thin liquids but has progressed to  soft solids with consumption of lots of water for "it to go down." She also reports that she has "random choking" episodes apart from POs. She reports that she recently passed out during such event and she is unable to sleep on her back as she wakes up coughing. Will pursue appropriate dysphagia orders after imaging has been completed.   In the interm, I recommend skilled ST to train pt in vocal hygiene, improve breath support for voice and reduce laryngeal tension in order to improve vocal quality, endurance, and quality of life.    Speech Therapy Frequency 2x / week    Duration 8 weeks    Treatment/Interventions SLP instruction and feedback;Patient/family education    Potential to Achieve Goals Fair    Potential Considerations Severity of impairments    Consulted and Agree with Plan of Care Patient             Patient will benefit from skilled therapeutic intervention in order to improve the following deficits and impairments:   Dysphonia    Problem List Patient Active Problem List   Diagnosis Date Noted   Nonspecific syndrome suggestive of viral illness 01/25/2021   Acute otalgia, left 01/25/2021   Vaginal dryness, menopausal 07/10/2019   Atypical ductal hyperplasia of right breast 06/25/2013   Dniya Neuhaus B. Rutherford Nail M.S., CCC-SLP, [place] Pathologist Rehabilitation Services Office [phone]  Stormy Fabian 04/13/2021, 3:53 PM  [place] MAIN [facility] SERVICES [address] [place], [place], [address] Phone: [phone]   Fax:  [phone]  Name: MAI LONGNECKER MRN: [id number] Date of Birth: 01/08/1956

## 2021-04-15 ENCOUNTER — Other Ambulatory Visit: Payer: Self-pay | Admitting: Unknown Physician Specialty

## 2021-04-15 ENCOUNTER — Ambulatory Visit: Payer: Commercial Managed Care - PPO | Admitting: Speech Pathology

## 2021-04-15 ENCOUNTER — Other Ambulatory Visit: Payer: Self-pay

## 2021-04-15 DIAGNOSIS — R1312 Dysphagia, oropharyngeal phase: Secondary | ICD-10-CM

## 2021-04-15 DIAGNOSIS — R1310 Dysphagia, unspecified: Secondary | ICD-10-CM

## 2021-04-15 DIAGNOSIS — J3801 Paralysis of vocal cords and larynx, unilateral: Secondary | ICD-10-CM

## 2021-04-15 DIAGNOSIS — E041 Nontoxic single thyroid nodule: Secondary | ICD-10-CM

## 2021-04-15 DIAGNOSIS — R490 Dysphonia: Secondary | ICD-10-CM | POA: Diagnosis not present

## 2021-04-15 DIAGNOSIS — R059 Cough, unspecified: Secondary | ICD-10-CM

## 2021-04-15 NOTE — Patient Instructions (Signed)
Compensatory Swallow Strategies - right head turn Phonation under pressure

## 2021-04-15 NOTE — Therapy (Addendum)
[place] MAIN [facility] SERVICES [address] [place], [place], [address] Phone: [phone]   Fax:  [phone]  Speech Language Pathology Treatment CLINICAL SWALLOW EVALUATION  Patient Details  Name: Virginia Fritz MRN: [id number] Date of Birth: September 21, 1955 Referring Provider (SLP): Dr Beverly Gust   Encounter Date: 04/15/2021   End of Session - 04/15/21 1848     Visit Number 2    Number of Visits 17    Date for SLP Re-Evaluation 06/08/21    Authorization Type [place] PPO    Authorization Time Period 04/12/2021 thru 06/08/2021    Authorization - Visit Number 2    Progress Note Due on Visit 10    SLP Start Time 0800    SLP Stop Time  0900    SLP Time Calculation (min) 60 min    Activity Tolerance Patient tolerated treatment well             Past Medical History:  Diagnosis Date   Arthritis    Atypical ductal hyperplasia of right breast February 2,2015   1 mm foci of ADH without margin involvement   Colon polyps    Environmental and seasonal allergies    Hx of blood clots 11/1996   PE post-operative on OCPs   Hyperlipidemia    Hyperlipidemia    Kidney calculi May 2014   Knee pain    Lt   Ovarian cyst    PE (pulmonary thromboembolism) ([place]) [id number]   PONV (postoperative nausea and vomiting)     Past Surgical History:  Procedure Laterality Date   ABDOMINAL HYSTERECTOMY  05/1997   BREAST SURGERY Right 07/02/2013   Foci of ADH on core biopsy, no upstaging on formal excision.   [place]   COLONOSCOPY  Dec 2013   [facility]   FOOT SURGERY  [id number]   KIDNEY STONE SURGERY     KNEE ARTHROSCOPY Left 01/09/2019   Procedure: ARTHROSCOPY KNEE, PARTIAL MEDIAL AND LATERAL MENISECTOMY;  Surgeon: Hessie Knows, MD;  Location: [facility] ORS;  Service: Orthopedics;  Laterality: Left;   LASIK  1994   LITHOTRIPSY  May 2014   REFRACTIVE SURGERY  1995   TOTAL ABDOMINAL HYSTERECTOMY W/ BILATERAL  SALPINGOOPHORECTOMY  02/2003   TUBAL LIGATION      There were no vitals filed for this visit.   Subjective Assessment - 04/15/21 1844     Subjective pt pleasant, questions about swallowing    Currently in Pain? No/denies                   ADULT SLP TREATMENT - 04/15/21 0001       Treatment Provided   Treatment provided Cognitive-Linquistic      Cognitive-Linquistic Treatment   Treatment focused on Voice;Patient/family/caregiver education    Skilled Treatment Skilled treatment session focused on pt's dysphonia. SLP facilitated session by providing instruction in moderately loud phonation of vowels while pressing down. No change in phonation was noted.              SLP Education - 04/15/21 1846     Education Details role of vocal cords with swallowing    Person(s) Educated Patient    Methods Explanation;Demonstration;Verbal cues;Handout    Comprehension Verbalized understanding;Need further instruction                SLP Long Term Goals - 04/15/21 1900       SLP LONG TERM GOAL #1   Title The patient will  be independent for abdominal breathing and breath support exercises.    Target Date 06/08/21      SLP LONG TERM GOAL #2   Title The patient will demonstrate independent understanding of vocal hygiene concepts.    Target Date 06/08/21      SLP LONG TERM GOAL #3   Title To improve vocal quality, vocal hygiene, voice projection, and prevent vocal fatigue    Target Date 06/08/21      SLP LONG TERM GOAL #4   Title Pt will utilize compensatory swallow strategies to consume least restrictive diet with no signs of respiratory compromise.    Baseline new goal    Time 8    Period Weeks    Status New    Target Date 06/08/21              Plan - 04/15/21 1848     Clinical Impression Statement When consuming food and thin liquids, pt presents with overt s/s of potential aspiration. During today's Clinical Swallow Evaluation, pt had immediate cough  when drinking thin water and moderate globus sensation when consuming graham crackers. Suspect these symptoms are related to pt's left vocal cord paralysis. Pt instructed in using a head turn to her right when swallowing thin liquids. Pt used head turn to right and was able to consume 4 oz of thin liquids without any overt s/s of potential aspiration. While this strategy didn't appear to reduce pt's globus sensation, it was helpful with liquid wash. While silent aspiration cannot be ruled out in a Clinical Swallow Evaluation, pt's risk appear reduced when using the above swallow strategy. At this time, pt is agreeable to this plan with Modified Barium Swallow Study deferred at this time. Extensive education provided on swallow function, diet recommendation, liquid consistencies and overall aspirations.    In addition to the Clinical Swallow Evaluation, pt was also asked to fill out the Voice Handicap Index.   VHI Score:  The Voice Handicap Index is comprised of a series of questions to assess the patient's perception of their voice. It is designed to evaluate the emotional, physical and functional components of the voice problem.   Functional: 31  Physical: 29 Emotional: 11 Total:  z score =  (4.15)  no significant impact = 0-1.00, mild = 1.01-1.99, moderate = 2.00-2.99, severe = 3.00+              Patient will benefit from skilled therapeutic intervention in order to improve the following deficits and impairments:   Dysphagia, oropharyngeal phase  Dysphonia  Unilateral vocal cord paralysis    Problem List Patient Active Problem List   Diagnosis Date Noted   Nonspecific syndrome suggestive of viral illness 01/25/2021   Acute otalgia, left 01/25/2021   Vaginal dryness, menopausal 07/10/2019   Atypical ductal hyperplasia of right breast 06/25/2013   Hershell Brandl B. Rutherford Nail M.S., CCC-SLP, [place] Pathologist Rehabilitation Services Office [phone]  Stormy Fabian 04/15/2021, 7:03 PM  [place] MAIN [facility] SERVICES [address] [place], [place], [address] Phone: [phone]   Fax:  [phone]   Name: Virginia Fritz MRN: [id number] Date of Birth: 13-Jul-1955

## 2021-04-18 ENCOUNTER — Ambulatory Visit: Payer: Commercial Managed Care - PPO | Admitting: Speech Pathology

## 2021-04-18 ENCOUNTER — Other Ambulatory Visit: Payer: Self-pay

## 2021-04-18 DIAGNOSIS — R1312 Dysphagia, oropharyngeal phase: Secondary | ICD-10-CM

## 2021-04-18 DIAGNOSIS — R490 Dysphonia: Secondary | ICD-10-CM

## 2021-04-18 DIAGNOSIS — J3801 Paralysis of vocal cords and larynx, unilateral: Secondary | ICD-10-CM

## 2021-04-18 NOTE — Patient Instructions (Signed)
Abdominal Breathing : 15 minutes, twice a day  Try it lying down to start. Shoulders down - this is a cue to relax Place your hand on your abdomen - this helps you focus on easy abdominal breath support - the best and most relaxed way to breathe Breathe in through your nose and fill your belly with air, watching your hand move outward Breathe out through your mouth and watch your belly move in. An audible "sh"  may help   Think of your belly as a balloon, when you fill with air (inhale), the balloon gets bigger. As the air goes out (exhale), the balloon deflates.  If you are having difficulty coordinating this, lay on your back with a plastic cup on your belly and repeat the above steps, watching your belly move up with inhalation and down with exhalations  Practice breathing in and out in front of a mirror, watching your belly Breathe in for a count of 5 and breathe out for a count of 5   For cough suppression:  Try breathing in through your nose, and then out through pursed lips or a drinking straw. Try to practice this initially when you're not coughing, so that it becomes comfortable and familiar.  Then, when you feel a tickle in your throat, see if you can breathe in this manner and reduce the severity of your cough.

## 2021-04-18 NOTE — Therapy (Signed)
[place] MAIN [facility] SERVICES [address] [place], [place], [address] Phone: [phone]   Fax:  [phone]  Speech Language Pathology Treatment  Patient Details  Name: Virginia Fritz MRN: [id number] Date of Birth: September 17, 1955 Referring Provider (SLP): Dr Beverly Gust   Encounter Date: 04/18/2021   End of Session - 04/18/21 1512     Visit Number 3    Number of Visits 17    Date for SLP Re-Evaluation 06/08/21    Authorization Type [place] PPO    Authorization Time Period 04/12/2021 thru 06/08/2021    Authorization - Visit Number 3    Progress Note Due on Visit 10    SLP Start Time 1400    SLP Stop Time  1500    SLP Time Calculation (min) 60 min    Activity Tolerance Patient tolerated treatment well             Past Medical History:  Diagnosis Date   Arthritis    Atypical ductal hyperplasia of right breast February 2,2015   1 mm foci of ADH without margin involvement   Colon polyps    Environmental and seasonal allergies    Hx of blood clots 11/1996   PE post-operative on OCPs   Hyperlipidemia    Hyperlipidemia    Kidney calculi May 2014   Knee pain    Lt   Ovarian cyst    PE (pulmonary thromboembolism) ([place]) [id number]   PONV (postoperative nausea and vomiting)     Past Surgical History:  Procedure Laterality Date   ABDOMINAL HYSTERECTOMY  05/1997   BREAST SURGERY Right 07/02/2013   Foci of ADH on core biopsy, no upstaging on formal excision.   [place]   COLONOSCOPY  Dec 2013   [facility]   FOOT SURGERY  [id number]   KIDNEY STONE SURGERY     KNEE ARTHROSCOPY Left 01/09/2019   Procedure: ARTHROSCOPY KNEE, PARTIAL MEDIAL AND LATERAL MENISECTOMY;  Surgeon: Hessie Knows, MD;  Location: [facility] ORS;  Service: Orthopedics;  Laterality: Left;   LASIK  1994   LITHOTRIPSY  May 2014   REFRACTIVE SURGERY  1995   TOTAL ABDOMINAL HYSTERECTOMY W/ BILATERAL SALPINGOOPHORECTOMY  02/2003    TUBAL LIGATION      There were no vitals filed for this visit.   Subjective Assessment - 04/18/21 1502     Subjective Patient had 2 major coughing episodes today    Currently in Pain? No/denies                   ADULT SLP TREATMENT - 04/18/21 1503       General Information   Behavior/Cognition Alert;Cooperative    HPI Pt is a 65 year old female who was diagnosed on 01/25/2021 with oropharyngeal shingles specifically ulcers to left lateral tongue, left side epiglottis, left vocal cord paralysis. Her last laryngoscopy on 03/31/2021 revealed that all ulcers were healed but her left vocal cord had minimal movement. CT neck noted for 2.7 cm left thyroid nodule 04/13/2021.      Treatment Provided   Treatment provided Cognitive-Linquistic      Pain Assessment   Pain Assessment No/denies pain      Cognitive-Linquistic Treatment   Treatment focused on Voice;Patient/family/caregiver education    Skilled Treatment Targeted dysphonia; reviewed phonation while bearing weight (/a//i//o/) trained previous session. Pt noted with short breath groups, shallow inspiration prior to phonation tasks and in conversation. Initiated training in abdominal breathing. Pt had  difficulty attaining in upright and semi-reclined position, so trained pt in supine on therapy mat. Initial min-mod cues necessary for relaxed breathing, duration (normal breath cycle vs extending beyond tidal volume). Pt with intermittent coughing episodes throughout session (mild); trained in cough suppression with nasal inhalation, exhale through pursed lips. Subsequently in upright position, pt able to attain abdominal breathing ~70% accuracy in isolation, progressed to airflow sounds during exhale ("Shhh"). Utilized pursed lip exhale for cough suppression x2 with verbal cue. Provided cough journal for pt to track coughing episodes.      Assessment / Recommendations / Plan   Plan Continue with current plan of care       Progression Toward Goals   Progression toward goals Progressing toward goals              SLP Education - 04/18/21 1512     Education Details pursed lip exhalation, abdominal breathing    Person(s) Educated Patient    Methods Explanation;Demonstration;Verbal cues;Handout;Tactile cues    Comprehension Verbalized understanding;Returned demonstration;Verbal cues required;Tactile cues required;Need further instruction                SLP Long Term Goals - 04/15/21 1900       SLP LONG TERM GOAL #1   Title The patient will be independent for abdominal breathing and breath support exercises.    Target Date 06/08/21      SLP LONG TERM GOAL #2   Title The patient will demonstrate independent understanding of vocal hygiene concepts.    Target Date 06/08/21      SLP LONG TERM GOAL #3   Title To improve vocal quality, vocal hygiene, voice projection, and prevent vocal fatigue    Target Date 06/08/21      SLP LONG TERM GOAL #4   Title Pt will utilize compensatory swallow strategies to consume least restrictive diet with no signs of respiratory compromise.    Baseline new goal    Time 8    Period Weeks    Status New    Target Date 06/08/21              Plan - 04/18/21 1512     Clinical Impression Statement Patient continues with dysphonia and dysphagia secondary to left vocal cord paralysis. Pt awaiting further assessment of thyroid nodule seen on CT neck. Dyspnea, short breath groups during conversation with SLP today. Initiated training in abdominal breathing for improved breath support with phonation exercises, as well as pursed lip breathing for cough suppression. Continue skilled ST for dysphonia and dysphagia to improve vocal quality and endurance, and reduce risk for aspiration.             Patient will benefit from skilled therapeutic intervention in order to improve the following deficits and impairments:   Dysphonia  Dysphagia, oropharyngeal  phase  Unilateral vocal cord paralysis    Problem List Patient Active Problem List   Diagnosis Date Noted   Nonspecific syndrome suggestive of viral illness 01/25/2021   Acute otalgia, left 01/25/2021   Vaginal dryness, menopausal 07/10/2019   Atypical ductal hyperplasia of right breast 06/25/2013   Deneise Lever, [place], CCC-SLP Speech-Language Pathologist  Aliene Altes, [place] 04/18/2021, 3:16 PM  [place] [address] [place], [place], [address] Phone: [phone]   Fax:  [phone]   Name: GIADA SCHOPPE MRN: [id number] Date of Birth: 1955-06-29

## 2021-04-20 ENCOUNTER — Other Ambulatory Visit: Payer: Self-pay

## 2021-04-20 ENCOUNTER — Ambulatory Visit: Payer: Commercial Managed Care - PPO | Admitting: Speech Pathology

## 2021-04-20 DIAGNOSIS — R490 Dysphonia: Secondary | ICD-10-CM | POA: Diagnosis not present

## 2021-04-20 DIAGNOSIS — J3801 Paralysis of vocal cords and larynx, unilateral: Secondary | ICD-10-CM

## 2021-04-20 NOTE — Patient Instructions (Signed)
Continue practicing your pushing with "ah" "eee" "ooo". You can add an "h" at the front for a slightly gentler onset. Remember to start with your belly! You should feel the power in your abdomen. If it starts out scratchy or hoarse, stop. Get a new breath, and try again. You are pushing down to stabilize. Try not to create extra tension in your neck or shoulders. All those muscles should stay relaxed.  If this goes well, you can move on to counting, one number per breath. Use the same technique, where you use a belly breath to power up. If it's sounding scratchy, you can stop this and just go back to doing what was successful.

## 2021-04-20 NOTE — Therapy (Signed)
[place] MAIN [facility] SERVICES [address] [place], [place], [address] Phone: [phone]   Fax:  [phone]  Speech Language Pathology Treatment  Patient Details  Name: Virginia Fritz MRN: [id number] Date of Birth: 1956/05/10 Referring Provider (SLP): Dr Beverly Gust   Encounter Date: 04/20/2021   End of Session - 04/20/21 1129     Visit Number 4    Number of Visits 17    Date for SLP Re-Evaluation 06/08/21    Authorization Type [employer] PPO    Authorization Time Period 04/12/2021 thru 06/08/2021    Authorization - Visit Number 4    Progress Note Due on Visit 10    SLP Start Time 1000    SLP Stop Time  1100    SLP Time Calculation (min) 60 min    Activity Tolerance Patient tolerated treatment well             Past Medical History:  Diagnosis Date   Arthritis    Atypical ductal hyperplasia of right breast February 2,2015   1 mm foci of ADH without margin involvement   Colon polyps    Environmental and seasonal allergies    Hx of blood clots 11/1996   PE post-operative on OCPs   Hyperlipidemia    Hyperlipidemia    Kidney calculi May 2014   Knee pain    Lt   Ovarian cyst    PE (pulmonary thromboembolism) ([place]) [id number]   PONV (postoperative nausea and vomiting)     Past Surgical History:  Procedure Laterality Date   ABDOMINAL HYSTERECTOMY  05/1997   BREAST SURGERY Right 07/02/2013   Foci of ADH on core biopsy, no upstaging on formal excision.   [place]   COLONOSCOPY  Dec 2013   [facility]   FOOT SURGERY  [id number]   KIDNEY STONE SURGERY     KNEE ARTHROSCOPY Left 01/09/2019   Procedure: ARTHROSCOPY KNEE, PARTIAL MEDIAL AND LATERAL MENISECTOMY;  Surgeon: Hessie Knows, MD;  Location: [facility] ORS;  Service: Orthopedics;  Laterality: Left;   LASIK  1994   LITHOTRIPSY  May 2014   REFRACTIVE SURGERY  1995   TOTAL ABDOMINAL HYSTERECTOMY W/ BILATERAL SALPINGOOPHORECTOMY  02/2003    TUBAL LIGATION      There were no vitals filed for this visit.          ADULT SLP TREATMENT - 04/20/21 1116       General Information   Behavior/Cognition Alert;Cooperative    HPI Pt is a 65 year old female who was diagnosed on 01/25/2021 with oropharyngeal shingles specifically ulcers to left lateral tongue, left side epiglottis, left vocal cord paralysis. Her last laryngoscopy on 03/31/2021 revealed that all ulcers were healed but her left vocal cord had minimal movement. CT neck noted for 2.7 cm left thyroid nodule 04/13/2021.      Cognitive-Linquistic Treatment   Treatment focused on Voice;Patient/family/caregiver education    Skilled Treatment Patient reports exhale through pursed lips has helped with cough suppression ~50% of the time. Reports an episode triggered while cleaning the bathroom. Has practiced abdominal breathing (AB) at home. After AB in isolation 90% accurate over 3 minute period (initial cues necessary for relaxed vs forced exhalation), progressed to phonation while bearing weight. Pt reports at home she gets "more hoarse" as she practices this and often results in coughing episode. Worked with pt to correct her technique (shoulders down, relax neck muscles, bear weight to stabilize/isolate muscles of phonation). Subsequently she  was able to produce short periods of clear phonation ranging 3-5 seconds using gentle onset (/ha/, /hi/, /ho/), 80% accuracy, with SLP fading cues from moderate to occasional min A (coordination, abdominal effort). Progressed to counting 1-10 with focus on breath support, loudness; modeling necessary for increasing pitch.      Assessment / Recommendations / Plan   Plan Continue with current plan of care      Progression Toward Goals   Progression toward goals Progressing toward goals              SLP Education - 04/20/21 1125     Education Details abdominal breathing    Person(s) Educated Patient    Methods Explanation     Comprehension Verbalized understanding                SLP Long Term Goals - 04/15/21 1900       SLP LONG TERM GOAL #1   Title The patient will be independent for abdominal breathing and breath support exercises.    Target Date 06/08/21      SLP LONG TERM GOAL #2   Title The patient will demonstrate independent understanding of vocal hygiene concepts.    Target Date 06/08/21      SLP LONG TERM GOAL #3   Title To improve vocal quality, vocal hygiene, voice projection, and prevent vocal fatigue    Target Date 06/08/21      SLP LONG TERM GOAL #4   Title Pt will utilize compensatory swallow strategies to consume least restrictive diet with no signs of respiratory compromise.    Baseline new goal    Time 8    Period Weeks    Status New    Target Date 06/08/21              Plan - 04/20/21 1130     Clinical Impression Statement Patient continues with dysphonia and dysphagia secondary to left vocal cord paralysis. Pt awaiting further assessment of thyroid nodule seen on CT neck. Dyspnea, short breath groups during conversation with SLP today. Progressing with abdominal breathing for improved breath support with phonation exercises, as well as pursed lip breathing for cough suppression. Able to recruit abdominal effort for improved phonation with sustained vowels and counting 1-10. Continue skilled ST for dysphonia and dysphagia to improve vocal quality and endurance, and reduce risk for aspiration.             Patient will benefit from skilled therapeutic intervention in order to improve the following deficits and impairments:   Dysphonia  Unilateral vocal cord paralysis    Problem List Patient Active Problem List   Diagnosis Date Noted   Nonspecific syndrome suggestive of viral illness 01/25/2021   Acute otalgia, left 01/25/2021   Vaginal dryness, menopausal 07/10/2019   Atypical ductal hyperplasia of right breast 06/25/2013   Deneise Lever, [place],  CCC-SLP Speech-Language Pathologist   Aliene Altes, [place] 04/20/2021, 11:31 AM  [facility] [address] [place], [place], [address] Phone: [phone]   Fax:  [phone]   Name: Virginia Fritz MRN: [id number] Date of Birth: 31-Jan-1956

## 2021-04-23 ENCOUNTER — Encounter: Payer: Self-pay | Admitting: Unknown Physician Specialty

## 2021-04-25 ENCOUNTER — Other Ambulatory Visit: Payer: Self-pay

## 2021-04-25 ENCOUNTER — Ambulatory Visit: Payer: Commercial Managed Care - PPO | Admitting: Speech Pathology

## 2021-04-25 DIAGNOSIS — J3801 Paralysis of vocal cords and larynx, unilateral: Secondary | ICD-10-CM

## 2021-04-25 DIAGNOSIS — R490 Dysphonia: Secondary | ICD-10-CM

## 2021-04-27 NOTE — Therapy (Signed)
[place] MAIN [facility] SERVICES [address] [place], [place], [address] Phone: [phone]   Fax:  [phone]  Speech Language Pathology Treatment  Patient Details  Name: Virginia Fritz MRN: [id number] Date of Birth: January 30, 1956 Referring Provider (SLP): Dr Beverly Gust   Encounter Date: 04/25/2021   End of Session - 04/27/21 1813     Visit Number 5    Number of Visits 17    Date for SLP Re-Evaluation 06/08/21    Authorization Type [employer] PPO    Authorization Time Period 04/12/2021 thru 06/08/2021    Authorization - Visit Number 5    Progress Note Due on Visit 10    SLP Start Time 1500    SLP Stop Time  1600    SLP Time Calculation (min) 60 min    Activity Tolerance Patient tolerated treatment well             Past Medical History:  Diagnosis Date   Arthritis    Atypical ductal hyperplasia of right breast February 2,2015   1 mm foci of ADH without margin involvement   Colon polyps    Environmental and seasonal allergies    Hx of blood clots 11/1996   PE post-operative on OCPs   Hyperlipidemia    Hyperlipidemia    Kidney calculi May 2014   Knee pain    Lt   Ovarian cyst    PE (pulmonary thromboembolism) ([place]) [id number]   PONV (postoperative nausea and vomiting)     Past Surgical History:  Procedure Laterality Date   ABDOMINAL HYSTERECTOMY  05/1997   BREAST SURGERY Right 07/02/2013   Foci of ADH on core biopsy, no upstaging on formal excision.   [place]   COLONOSCOPY  Dec 2013   [facility]   FOOT SURGERY  [phone]   KIDNEY STONE SURGERY     KNEE ARTHROSCOPY Left 01/09/2019   Procedure: ARTHROSCOPY KNEE, PARTIAL MEDIAL AND LATERAL MENISECTOMY;  Surgeon: Hessie Knows, MD;  Location: [facility] ORS;  Service: Orthopedics;  Laterality: Left;   LASIK  1994   LITHOTRIPSY  May 2014   REFRACTIVE SURGERY  1995   TOTAL ABDOMINAL HYSTERECTOMY W/ BILATERAL SALPINGOOPHORECTOMY  02/2003    TUBAL LIGATION      There were no vitals filed for this visit.   Subjective Assessment - 04/27/21 1809     Subjective pt had coughing episode this morning and it was difficult to perform voice exercises afterwards    Currently in Pain? No/denies                   ADULT SLP TREATMENT - 04/27/21 0001       Treatment Provided   Treatment provided Cognitive-Linquistic      Cognitive-Linquistic Treatment   Treatment focused on Voice;Patient/family/caregiver education    Skilled Treatment Patient reports exhale through pursed lips has helped with cough suppression ~80% of the time.  Has practiced abdominal breathing (AB) at home. After AB in isolation 90% accurate over 3 minute period (initial cues necessary for relaxed vs forced exhalation), progressed to phonation while bearing weight. Pt able to demonstrate without tension. Subsequently she was able to produce short periods of clear phonation ranging 3-5 seconds using gentle onset (/ha/, /hi/, /ho/), 100% accuracy independently. Pt able to achieve clear phonation without physical pressure to chair with 95% accuracy. Pt able to carryover and progressed to multi-syllablic words and short phrases with focus on breath support, loudness; modeling necessary  for increasing pitch.              SLP Education - 04/27/21 1813     Education Details provided    Person(s) Educated Patient    Methods Explanation    Comprehension Verbalized understanding                SLP Long Term Goals - 04/15/21 1900       SLP LONG TERM GOAL #1   Title The patient will be independent for abdominal breathing and breath support exercises.    Target Date 06/08/21      SLP LONG TERM GOAL #2   Title The patient will demonstrate independent understanding of vocal hygiene concepts.    Target Date 06/08/21      SLP LONG TERM GOAL #3   Title To improve vocal quality, vocal hygiene, voice projection, and prevent vocal fatigue    Target Date  06/08/21      SLP LONG TERM GOAL #4   Title Pt will utilize compensatory swallow strategies to consume least restrictive diet with no signs of respiratory compromise.    Baseline new goal    Time 8    Period Weeks    Status New    Target Date 06/08/21              Plan - 04/27/21 1814     Clinical Impression Statement Patient continues with dysphonia and dysphagia secondary to left vocal cord paralysis. Pt awaiting further assessment of thyroid nodule seen on CT neck. Dyspnea, short breath groups during conversation with SLP today. Progressing with abdominal breathing for improved breath support with phonation exercises, as well as pursed lip breathing for cough suppression. Able to recruit abdominal effort for improved phonation with sustained vowels and counting 1-10. Continue skilled ST for dysphonia and dysphagia to improve vocal quality and endurance, and reduce risk for aspiration.    Speech Therapy Frequency 2x / week    Duration 8 weeks    Treatment/Interventions SLP instruction and feedback;Patient/family education    Potential to Achieve Goals Fair    Potential Considerations Severity of impairments    Consulted and Agree with Plan of Care Patient             Patient will benefit from skilled therapeutic intervention in order to improve the following deficits and impairments:   Dysphonia  Unilateral vocal cord paralysis    Problem List Patient Active Problem List   Diagnosis Date Noted   Nonspecific syndrome suggestive of viral illness 01/25/2021   Acute otalgia, left 01/25/2021   Vaginal dryness, menopausal 07/10/2019   Atypical ductal hyperplasia of right breast 06/25/2013   Muzammil Bruins B. Rutherford Nail M.S., CCC-SLP, [facility] Speech-Language Pathologist Rehabilitation Services Office [id number], [place] 04/27/2021, 6:14 PM  [place] MAIN [facility] SERVICES [address] [place], [place], [address] Phone:  [phone]   Fax:  [phone]   Name: Virginia Fritz MRN: [id number] Date of Birth: [birth year]

## 2021-04-28 ENCOUNTER — Other Ambulatory Visit: Payer: Self-pay

## 2021-04-28 ENCOUNTER — Ambulatory Visit
Admission: RE | Admit: 2021-04-28 | Discharge: 2021-04-28 | Disposition: A | Discharge status: Home / Self Care | Payer: Commercial Managed Care - PPO | Source: Ambulatory Visit | Attending: Unknown Physician Specialty | Admitting: Unknown Physician Specialty

## 2021-04-28 ENCOUNTER — Encounter: Payer: Commercial Managed Care - PPO | Admitting: Speech Pathology

## 2021-04-28 DIAGNOSIS — E041 Nontoxic single thyroid nodule: Secondary | ICD-10-CM

## 2021-04-29 ENCOUNTER — Other Ambulatory Visit: Payer: Self-pay | Admitting: Unknown Physician Specialty

## 2021-04-29 DIAGNOSIS — E041 Nontoxic single thyroid nodule: Secondary | ICD-10-CM

## 2021-05-03 ENCOUNTER — Ambulatory Visit
Admission: RE | Admit: 2021-05-03 | Discharge: 2021-05-03 | Disposition: A | Discharge status: Home / Self Care | Payer: Commercial Managed Care - PPO | Source: Ambulatory Visit | Attending: Unknown Physician Specialty | Admitting: Unknown Physician Specialty

## 2021-05-03 ENCOUNTER — Other Ambulatory Visit: Payer: Self-pay | Admitting: Unknown Physician Specialty

## 2021-05-03 ENCOUNTER — Ambulatory Visit: Payer: Commercial Managed Care - PPO | Admitting: Speech Pathology

## 2021-05-03 ENCOUNTER — Other Ambulatory Visit: Payer: Self-pay

## 2021-05-03 DIAGNOSIS — E042 Nontoxic multinodular goiter: Secondary | ICD-10-CM | POA: Diagnosis not present

## 2021-05-03 DIAGNOSIS — E041 Nontoxic single thyroid nodule: Secondary | ICD-10-CM

## 2021-05-03 NOTE — Procedures (Signed)
Successful US guided FNA of left mid to inferior thyroid nodule # 6 Successful US guided FNA of right posterior thyroid nodule # 3 Nodule # 5 unable to be reproduced today with [place] therefore no FNA was performed, recommend continued [place] follow-up with close attention to this area of concern.  No complications. See PACS for full report.  Tsosie Billing D, PA-C 05/03/2021, 2:13 PM

## 2021-05-04 LAB — CYTOLOGY - NON PAP

## 2021-05-05 ENCOUNTER — Ambulatory Visit: Payer: Commercial Managed Care - PPO | Attending: Unknown Physician Specialty | Admitting: Speech Pathology

## 2021-05-05 ENCOUNTER — Other Ambulatory Visit: Payer: Self-pay

## 2021-05-05 DIAGNOSIS — R1312 Dysphagia, oropharyngeal phase: Secondary | ICD-10-CM | POA: Diagnosis present

## 2021-05-05 DIAGNOSIS — J3801 Paralysis of vocal cords and larynx, unilateral: Secondary | ICD-10-CM | POA: Insufficient documentation

## 2021-05-05 DIAGNOSIS — R490 Dysphonia: Secondary | ICD-10-CM | POA: Diagnosis not present

## 2021-05-05 NOTE — Therapy (Signed)
[place] MAIN [facility] SERVICES [address] [place], [place], [address] Phone: [phone]   Fax:  [phone]  Speech Language Pathology Treatment  Patient Details  Name: Virginia Fritz MRN: [id number] Date of Birth: [birth year] Referring Provider (SLP): Dr Beverly Gust   Encounter Date: 05/05/2021   End of Session - 05/05/21 1633     Visit Number 6    Number of Visits 17    Date for SLP Re-Evaluation 06/08/21    Authorization Type [employer] PPO    Authorization Time Period 04/12/2021 thru 06/08/2021    Authorization - Visit Number 6    Progress Note Due on Visit 10    SLP Start Time 0800    SLP Stop Time  0900    SLP Time Calculation (min) 60 min    Activity Tolerance Patient tolerated treatment well             Past Medical History:  Diagnosis Date   Arthritis    Atypical ductal hyperplasia of right breast February 2,2015   1 mm foci of ADH without margin involvement   Colon polyps    Environmental and seasonal allergies    Hx of blood clots 11/1996   PE post-operative on OCPs   Hyperlipidemia    Hyperlipidemia    Kidney calculi May 2014   Knee pain    Lt   Ovarian cyst    PE (pulmonary thromboembolism) ([place]) [id number]   PONV (postoperative nausea and vomiting)     Past Surgical History:  Procedure Laterality Date   ABDOMINAL HYSTERECTOMY  05/1997   BREAST SURGERY Right 07/02/2013   Foci of ADH on core biopsy, no upstaging on formal excision.   [place]   COLONOSCOPY  Dec 2013   [facility]   FOOT SURGERY  [phone]   KIDNEY STONE SURGERY     KNEE ARTHROSCOPY Left 01/09/2019   Procedure: ARTHROSCOPY KNEE, PARTIAL MEDIAL AND LATERAL MENISECTOMY;  Surgeon: Hessie Knows, MD;  Location: [facility] ORS;  Service: Orthopedics;  Laterality: Left;   LASIK  1994   LITHOTRIPSY  May 2014   REFRACTIVE SURGERY  1995   TOTAL ABDOMINAL HYSTERECTOMY W/ BILATERAL SALPINGOOPHORECTOMY  02/2003    TUBAL LIGATION      There were no vitals filed for this visit.   Subjective Assessment - 05/05/21 1630     Subjective "I had my thyroid biopsied"    Currently in Pain? No/denies                   ADULT SLP TREATMENT - 05/05/21 0001       Treatment Provided   Treatment provided Cognitive-Linquistic      Cognitive-Linquistic Treatment   Treatment focused on Voice;Patient/family/caregiver education    Skilled Treatment Pt reports vocal improvement and when reading list of multisyllable words and functional phrases, pt's voice sounded subjectively improved. The Perceptual Voice Evaluation was administered to gather objective data. During initial voice assessment, pt's coughing impeded ability to perform any tasks on evaluation. See results in the Clinical Impression Section.              SLP Education - 05/05/21 1633     Education Details progress as demonstrated by ability to complete Perceptual Voice Evaluation today    Person(s) Educated Patient    Methods Explanation;Demonstration    Comprehension Verbalized understanding                SLP Long Term  Goals - 04/15/21 1900       SLP LONG TERM GOAL #1   Title The patient will be independent for abdominal breathing and breath support exercises.    Target Date 06/08/21      SLP LONG TERM GOAL #2   Title The patient will demonstrate independent understanding of vocal hygiene concepts.    Target Date 06/08/21      SLP LONG TERM GOAL #3   Title To improve vocal quality, vocal hygiene, voice projection, and prevent vocal fatigue    Target Date 06/08/21      SLP LONG TERM GOAL #4   Title Pt will utilize compensatory swallow strategies to consume least restrictive diet with no signs of respiratory compromise.    Baseline new goal    Time 8    Period Weeks    Status New    Target Date 06/08/21              Plan - 05/05/21 1634     Clinical Impression Statement Pt continues with moderate  dysphonia and continued weight loss d/t dysphagia. During this session she was able to ocmplete the Perceptual Voice Evaluation without coughing or throat clearing. See below. I recommend skilled ST to train pt in vocal hygiene, improve breath support for voice and reduce laryngeal tension in order to improve vocal quality, endurance, and quality of life.    Perceptual Voice Evaluation    Voice Case History     Health risks:  n/a    Characteristic voice use: uses voice approximately 3 hours per day for conversation   Environmental risks: n/a   Misuse: n/a   Phonotraumatic behaviors:  significantly reduced throat clears and coughs   Vocal characteristics: hoarse, gravelly, reduced vocal intensity, reduced about to project, vocal fatigue   Objective Voice Measurements   Maximum phonation time for sustained "ah": 6   Average fundamental frequency during sustained "ah": 226   (1 SD below average of  244 Hz +27 for gender)      Habitual pitch: 220 Hz    Highest dynamic pitch in conversational speech: 225 Hz   Lowest dynamic pitch in conversational speech: 147 Hz   Average time patient was able to sustain /s/:  9.68 seconds   Average time patient was able to sustain /z/: 8.04 seconds   s/z ratio:  (suggestive of dysfunction > 1.0) 1.2      Speech Therapy Frequency 2x / week    Duration 8 weeks    Treatment/Interventions SLP instruction and feedback;Patient/family education    Potential to Achieve Goals Fair    Potential Considerations Severity of impairments    SLP Home Exercise Plan provided, see pt instructions section    Consulted and Agree with Plan of Care Patient             Patient will benefit from skilled therapeutic intervention in order to improve the following deficits and impairments:   Dysphonia    Problem List Patient Active Problem List   Diagnosis Date Noted   Nonspecific syndrome suggestive of viral illness 01/25/2021   Acute otalgia, left  01/25/2021   Vaginal dryness, menopausal 07/10/2019   Atypical ductal hyperplasia of right breast 06/25/2013   Makeda Peeks B. Rutherford Nail M.S., CCC-SLP, [facility] Pathologist Rehabilitation Services Office [phone]  Stormy Fabian, [place] 05/05/2021, 4:36 PM  [place] MAIN [facility] SERVICES [address] [place], [place], [address] Phone: [phone]   Fax:  [phone]   Name: Cherika  NEJLA REASOR MRN: [id number] Date of Birth: [birth year]

## 2021-05-05 NOTE — Patient Instructions (Signed)
Continue practice abdominal breathing and phonation exercises

## 2021-05-07 ENCOUNTER — Other Ambulatory Visit: Payer: Self-pay | Admitting: Unknown Physician Specialty

## 2021-05-07 DIAGNOSIS — E041 Nontoxic single thyroid nodule: Secondary | ICD-10-CM

## 2021-05-09 ENCOUNTER — Ambulatory Visit: Payer: Commercial Managed Care - PPO | Admitting: Speech Pathology

## 2021-05-11 ENCOUNTER — Encounter: Payer: Commercial Managed Care - PPO | Admitting: Speech Pathology

## 2021-05-18 ENCOUNTER — Ambulatory Visit: Payer: Commercial Managed Care - PPO | Admitting: Speech Pathology

## 2021-05-20 ENCOUNTER — Ambulatory Visit: Payer: Commercial Managed Care - PPO | Admitting: Speech Pathology

## 2021-05-25 ENCOUNTER — Ambulatory Visit: Payer: Commercial Managed Care - PPO | Admitting: Speech Pathology

## 2021-05-25 ENCOUNTER — Other Ambulatory Visit: Payer: Self-pay

## 2021-05-25 DIAGNOSIS — R1312 Dysphagia, oropharyngeal phase: Secondary | ICD-10-CM

## 2021-05-25 DIAGNOSIS — R490 Dysphonia: Secondary | ICD-10-CM | POA: Diagnosis not present

## 2021-05-25 DIAGNOSIS — J3801 Paralysis of vocal cords and larynx, unilateral: Secondary | ICD-10-CM

## 2021-05-25 NOTE — Patient Instructions (Signed)
Continue abdominal breathing, slow/flow phonation, and easy onset exercises. Increase hydration and avoid harmful phonatory behaviors (excessive coughing) as able.

## 2021-05-25 NOTE — Therapy (Signed)
[place] MAIN [facility] SERVICES [address] [place], [place], [address] Phone: [phone]   Fax:  [phone]  Speech Language Pathology Treatment  Patient Details  Name: Virginia Fritz MRN: [id number] Date of Birth: [birth year] Referring Provider (SLP): Dr Beverly Gust   Encounter Date: 05/25/2021   End of Session - 05/25/21 0941     Visit Number 7    Number of Visits 17    Date for SLP Re-Evaluation 06/08/21    Authorization Type [employer] PPO    Authorization Time Period 04/12/2021 thru 06/08/2021    Authorization - Visit Number 7    Progress Note Due on Visit 10    SLP Start Time 0805    SLP Stop Time  0905    SLP Time Calculation (min) 60 min    Activity Tolerance Patient tolerated treatment well             Past Medical History:  Diagnosis Date   Arthritis    Atypical ductal hyperplasia of right breast February 2,2015   1 mm foci of ADH without margin involvement   Colon polyps    Environmental and seasonal allergies    Hx of blood clots 11/1996   PE post-operative on OCPs   Hyperlipidemia    Hyperlipidemia    Kidney calculi May 2014   Knee pain    Lt   Ovarian cyst    PE (pulmonary thromboembolism) ([place]) [id number]   PONV (postoperative nausea and vomiting)     Past Surgical History:  Procedure Laterality Date   ABDOMINAL HYSTERECTOMY  05/1997   BREAST SURGERY Right 07/02/2013   Foci of ADH on core biopsy, no upstaging on formal excision.   [place]   COLONOSCOPY  Dec 2013   [facility]   FOOT SURGERY  [phone]   KIDNEY STONE SURGERY     KNEE ARTHROSCOPY Left 01/09/2019   Procedure: ARTHROSCOPY KNEE, PARTIAL MEDIAL AND LATERAL MENISECTOMY;  Surgeon: Hessie Knows, MD;  Location: [facility] ORS;  Service: Orthopedics;  Laterality: Left;   LASIK  1994   LITHOTRIPSY  May 2014   REFRACTIVE SURGERY  1995   TOTAL ABDOMINAL HYSTERECTOMY W/ BILATERAL SALPINGOOPHORECTOMY  02/2003    TUBAL LIGATION      There were no vitals filed for this visit.   Subjective Assessment - 05/25/21 0918     Subjective My doctors appts had to be cancelled bc our flight was delayed for the weather    Currently in Pain? No/denies                   ADULT SLP TREATMENT - 05/25/21 0001       General Information   Behavior/Cognition Alert;Cooperative      Treatment Provided   Treatment provided Cognitive-Linquistic      Cognitive-Linquistic Treatment   Treatment focused on Voice;Patient/family/caregiver education    Skilled Treatment Pt reports vocal improvement compared to 1 month ago however on arrival vocal quality is significantly strained and hoarse. Patient demonstrated abdominal breathing exercises for improved respiratory support during voicing--min assist for appropriate form and "soft /h/" onset which successfully reduced coughing. Patient demonstrated gentle onset sustained vowels for hierarachal progression of voicing without strain/tension. Patient required visual cues and SLP models for accurate demonstrations. Patient demonstrated carryover to sentency with 60% accuracy x15 improved to 100% accuracy with re-attempt and cue for easy onset, flow phonation, and slowed rate. Patient demonstrated carryover to conversational level with 80% accuracy  x20 recorded responses with intermittent cues to slow rate and increase breath support for improved "smooth" voicing.              SLP Education - 05/25/21 0940     Education Details alternative strategies for improve voicing    Person(s) Educated Patient    Methods Explanation;Demonstration    Comprehension Verbalized understanding;Need further instruction                SLP Long Term Goals - 04/15/21 1900       SLP LONG TERM GOAL #1   Title The patient will be independent for abdominal breathing and breath support exercises.    Target Date 06/08/21      SLP LONG TERM GOAL #2   Title The patient will  demonstrate independent understanding of vocal hygiene concepts.    Target Date 06/08/21      SLP LONG TERM GOAL #3   Title To improve vocal quality, vocal hygiene, voice projection, and prevent vocal fatigue    Target Date 06/08/21      SLP LONG TERM GOAL #4   Title Pt will utilize compensatory swallow strategies to consume least restrictive diet with no signs of respiratory compromise.    Baseline new goal    Time 8    Period Weeks    Status New    Target Date 06/08/21              Plan - 05/25/21 0941     Clinical Impression Statement Pt continues with moderate dysphonia and continued weight loss d/t dysphagia. During this session patient demonstrated improved vocal quality with reduced strain after moderate assistance for gentle onset and flow phonation strategies. Excellent carryover to conversational task with min assist for slowed rate and strategy use. Continue to recommend skilled ST to train pt in vocal hygiene, improve breath support for voice and reduce laryngeal tension in order to improve vocal quality, endurance, and quality of life.    Speech Therapy Frequency 2x / week    Duration 8 weeks    Treatment/Interventions SLP instruction and feedback;Patient/family education    Potential to Achieve Goals Fair    Potential Considerations Severity of impairments;Medical prognosis    SLP Home Exercise Plan provided, see pt instructions section    Consulted and Agree with Plan of Care Patient             Patient will benefit from skilled therapeutic intervention in order to improve the following deficits and impairments:   Dysphonia  Unilateral vocal cord paralysis  Dysphagia, oropharyngeal phase    Problem List Patient Active Problem List   Diagnosis Date Noted   Nonspecific syndrome suggestive of viral illness 01/25/2021   Acute otalgia, left 01/25/2021   Vaginal dryness, menopausal 07/10/2019   Atypical ductal hyperplasia of right breast 06/25/2013    [place] L. Yocheved Depner, M.A. [place] [phone]  [place], [place] 05/25/2021, 9:44 AM  [place] MAIN [facility] SERVICES [address] [place], [place], [address] Phone: [phone]   Fax:  [phone]   Name: AVEA MCGOWEN MRN: [id number] Date of Birth: 1955/07/06

## 2021-05-27 ENCOUNTER — Ambulatory Visit: Payer: Commercial Managed Care - PPO | Admitting: Speech Pathology

## 2021-05-31 ENCOUNTER — Ambulatory Visit: Payer: Commercial Managed Care - PPO | Admitting: Speech Pathology

## 2021-06-02 ENCOUNTER — Ambulatory Visit: Payer: Commercial Managed Care - PPO | Admitting: Speech Pathology

## 2021-06-02 ENCOUNTER — Other Ambulatory Visit: Payer: Self-pay | Admitting: Pulmonary Disease

## 2021-06-02 DIAGNOSIS — T17908A Unspecified foreign body in respiratory tract, part unspecified causing other injury, initial encounter: Secondary | ICD-10-CM

## 2021-06-07 ENCOUNTER — Other Ambulatory Visit: Payer: Self-pay

## 2021-06-07 ENCOUNTER — Ambulatory Visit: Payer: Commercial Managed Care - PPO | Attending: Unknown Physician Specialty | Admitting: Speech Pathology

## 2021-06-07 DIAGNOSIS — R490 Dysphonia: Secondary | ICD-10-CM | POA: Diagnosis present

## 2021-06-07 DIAGNOSIS — J3801 Paralysis of vocal cords and larynx, unilateral: Secondary | ICD-10-CM | POA: Insufficient documentation

## 2021-06-07 DIAGNOSIS — R1312 Dysphagia, oropharyngeal phase: Secondary | ICD-10-CM | POA: Diagnosis present

## 2021-06-08 NOTE — Therapy (Signed)
[place] MAIN [facility] SERVICES [address] [place], [place], [address] Phone: [phone]   Fax:  [phone]  Speech Language Pathology Treatment  Patient Details  Name: REATHA SUR MRN: [id number] Date of Birth: [birth year] Referring Provider (SLP): Dr Beverly Gust   Encounter Date: 06/07/2021   End of Session - 06/08/21 0750     Visit Number 8    Number of Visits 16    Date for SLP Re-Evaluation 07/06/21    Authorization Type [employer] PPO    Authorization Time Period 06/07/2021 thru 07/06/2021    Authorization - Visit Number 8    Progress Note Due on Visit 10    SLP Start Time 0900    SLP Stop Time  1000    SLP Time Calculation (min) 60 min    Activity Tolerance Patient tolerated treatment well             Past Medical History:  Diagnosis Date   Arthritis    Atypical ductal hyperplasia of right breast February 2,2015   1 mm foci of ADH without margin involvement   Colon polyps    Environmental and seasonal allergies    Hx of blood clots 11/1996   PE post-operative on OCPs   Hyperlipidemia    Hyperlipidemia    Kidney calculi May 2014   Knee pain    Lt   Ovarian cyst    PE (pulmonary thromboembolism) ([place]) [id number]   PONV (postoperative nausea and vomiting)     Past Surgical History:  Procedure Laterality Date   ABDOMINAL HYSTERECTOMY  05/1997   BREAST SURGERY Right 07/02/2013   Foci of ADH on core biopsy, no upstaging on formal excision.   [place]   COLONOSCOPY  Dec 2013   [facility]   FOOT SURGERY  [id number]   KIDNEY STONE SURGERY     KNEE ARTHROSCOPY Left 01/09/2019   Procedure: ARTHROSCOPY KNEE, PARTIAL MEDIAL AND LATERAL MENISECTOMY;  Surgeon: Hessie Knows, MD;  Location: [facility] ORS;  Service: Orthopedics;  Laterality: Left;   LASIK  1994   LITHOTRIPSY  May 2014   REFRACTIVE SURGERY  1995   TOTAL ABDOMINAL HYSTERECTOMY W/ BILATERAL SALPINGOOPHORECTOMY  02/2003    TUBAL LIGATION      There were no vitals filed for this visit.   Subjective Assessment - 06/08/21 0747     Subjective "I feel so much better"    Currently in Pain? No/denies                   ADULT SLP TREATMENT - 06/08/21 0001       Treatment Provided   Treatment provided Cognitive-Linquistic      Cognitive-Linquistic Treatment   Treatment focused on Voice;Patient/family/caregiver education    Skilled Treatment Pt arrived to session with much improved vocal quality that she credits to recent prescription of gabapentin.  Pt reports that recent consult with Pulumonogist, Dr Elwyn Lade. She also informed SLP of order from Dr Lanney Gins for a Modified Barium Swallow Study d/t concern for aspiration. Order found in pt's chart with SLP assisting in scheduling study for Thursday January 12th at 3pm with this therapist. SLP further facilitated session by re-evaluating pt's voice using the Perceptual Voice Evaluation. Improvements noted in several areas when compared to 05/05/2021.  Objective Voice Measurements  Maximum phonation time for sustained ah: 5 seconds  Average fundamental frequency during sustained ah: 226 Hz (1 SD below average 244 Hz + 27 for  gender)  Habitual pitch: 230 Hz (improved from 220 Hz)  Highest dynamic pitch when altering pitch from a low note to a high note: 392 Hz (improved from 391 Hz)  Lowest dynamic pitch when altering from a high note to a low note: 152 Hz (improved from 170 Hz)  Highest dynamic pitch in conversational speech: 244 Hz (improved from 225 Hz)  Lowest dynamic pitch in conversational speech: 158 Hz   Average time patient was able to sustain /s/: 7.0 seconds  Average time patient was able to sustain /z/: 9.54 seconds (improved from 8.04)  s/z ratio : .7 (suggestive of dysfunction>1.0) - improved from 4.15  VHI Score:  The Voice Handicap Index is comprised of a series of questions to assess the patient's perception of their  voice. It is designed to evaluate the emotional, physical and functional components of the voice problem.   Functional: 17  Physical: 21  Emotional: 2 Total:  z score =  .7 - no significant impact = 0-1.00, mild = 1.01-1.99, moderate = 2.00-2.99, severe = 3.00+; improved from 4.1  SLP further facilitated session by providing moderate to minimal cues to increase vocal intensity for improved vocal quality. Pt able to improve from 70dB to 75dB at the phrase level with improved vocal quality notes. Pt stated that "it feels like I am yelling."              SLP Education - 06/08/21 0749     Education Details improved vocal intensity = improved vocal quality    Person(s) Educated Patient    Methods Explanation;Demonstration;Verbal cues    Comprehension Verbalized understanding;Need further instruction                SLP Long Term Goals - 06/08/21 0751       SLP LONG TERM GOAL #1   Title The patient will be independent for abdominal breathing and breath support exercises.    Baseline miminal assistance    Time 4    Period Weeks    Status On-going    Target Date 07/06/21      SLP LONG TERM GOAL #2   Title The patient will demonstrate independent understanding of vocal hygiene concepts.    Status Achieved      SLP LONG TERM GOAL #3   Title To improve vocal quality, vocal hygiene, voice projection, and prevent vocal fatigue    Baseline much improved    Time 4    Period Weeks    Status On-going    Target Date 07/06/21      SLP LONG TERM GOAL #4   Title Pt will utilize compensatory swallow strategies to consume least restrictive diet with no signs of respiratory compromise.    Baseline 06/07/2021 - order received for instrumental swallow study to further assess swallow function    Time 4    Period Weeks    Status On-going    Target Date 07/06/21              Plan - 06/08/21 0751     Clinical Impression Statement Pt continues with moderate deficits in respiratory  support for phonation, however with moderate to minimal support, pt able to increase vocal intensity using abdominal breathing. I recommend skilled ST to improve breath support for voice in order to improve vocal quality, endurance, and quality of life.    Speech Therapy Frequency 2x / week    Duration 4 weeks    Treatment/Interventions SLP instruction and feedback;Patient/family education  Potential to Achieve Goals Good    SLP Home Exercise Plan provided, see pt instructions section    Consulted and Agree with Plan of Care Patient             Patient will benefit from skilled therapeutic intervention in order to improve the following deficits and impairments:   Dysphonia  Dysphagia, oropharyngeal phase  Unilateral vocal cord paralysis    Problem List Patient Active Problem List   Diagnosis Date Noted   Nonspecific syndrome suggestive of viral illness 01/25/2021   Acute otalgia, left 01/25/2021   Vaginal dryness, menopausal 07/10/2019   Atypical ductal hyperplasia of right breast 06/25/2013   Rhemi Balbach B. Rutherford Nail M.S., CCC-SLP, [facility] Pathologist Rehabilitation Services Office [phone]  Stormy Fabian, [place] 06/08/2021, 7:55 AM  [place] MAIN [facility] SERVICES [address] [place], [place], [address] Phone: [phone]   Fax:  [phone]   Name: AMOR HYLE MRN: [id number] Date of Birth: 1956-03-18

## 2021-06-08 NOTE — Patient Instructions (Signed)
Abdominal breathing  Instructions provided for MBSS

## 2021-06-09 ENCOUNTER — Other Ambulatory Visit: Payer: Self-pay

## 2021-06-09 ENCOUNTER — Ambulatory Visit
Admission: RE | Admit: 2021-06-09 | Discharge: 2021-06-09 | Disposition: A | Discharge status: Home / Self Care | Payer: Commercial Managed Care - PPO | Source: Ambulatory Visit | Attending: Pulmonary Disease | Admitting: Pulmonary Disease

## 2021-06-09 ENCOUNTER — Ambulatory Visit: Payer: Commercial Managed Care - PPO | Admitting: Speech Pathology

## 2021-06-09 DIAGNOSIS — R490 Dysphonia: Secondary | ICD-10-CM

## 2021-06-09 DIAGNOSIS — T17908A Unspecified foreign body in respiratory tract, part unspecified causing other injury, initial encounter: Secondary | ICD-10-CM | POA: Insufficient documentation

## 2021-06-09 DIAGNOSIS — J3801 Paralysis of vocal cords and larynx, unilateral: Secondary | ICD-10-CM

## 2021-06-09 NOTE — Patient Instructions (Signed)
Use a moderately loud, good quality voice when reading list of functional phrases

## 2021-06-09 NOTE — Progress Notes (Signed)
Modified Barium Swallow Progress Note  Patient Details  Name: Virginia Fritz MRN: [id number] Date of Birth: 11-Jun-1955  Today's Date: 06/09/2021  Modified Barium Swallow completed.  Full report located under Chart Review in the Imaging Section.  Brief recommendations include the following:  Clinical Impression  Pt presents with adequate oropharyngeal abilities when consuming nectar thick liquids via cup, thin liquids via cup and straw, puree and graham crackers. While pt did cough during the study, her cough was NOT related to airway compromise. Pt appears to have osteophytes on C6 and C7. These osteophytes appear to impact the posterior esophageal wall. There were occasions that some of the bolus became impinged between osteophytes (image 3). During this study, pt's cough was in direct relationship to decreased bolus passage thru upper esophagus (image 16). An esophageal sweep also revealed retrograde movement of bolus (images 24-26). At this time, pt does not present with oropharyngeal dysphagia.  Extensive time spent after the study revealing the images with pt.    Swallow Evaluation Recommendations   Recommended Consults: Consider GI evaluation   SLP Diet Recommendations: Regular solids;Thin liquid   Liquid Administration via: Cup;Straw   Medication Administration: Whole meds with liquid   Supervision: Patient able to self feed   Compensations: Minimize environmental distractions;Slow rate;Small sips/bites   Postural Changes: Remain semi-upright after after feeds/meals (Comment);Seated upright at 90 degrees   Oral Care Recommendations: Oral care BID      Jhamir Pickup B. Rutherford Nail M.S., CCC-SLP, [place] Pathologist Rehabilitation Services Office [place] 06/09/2021,4:20 PM

## 2021-06-09 NOTE — Therapy (Signed)
[place] MAIN [facility] SERVICES [address] [place], [place], [address] Phone: [phone]   Fax:  [phone]  Speech Language Pathology Treatment  Patient Details  Name: Virginia Fritz MRN: [id number] Date of Birth: 1956/03/10 Referring Provider (SLP): Dr Beverly Gust   Encounter Date: 06/09/2021   End of Session - 06/09/21 1202     Visit Number 9    Number of Visits 16    Date for SLP Re-Evaluation 07/06/21    Authorization Type [employer] PPO    Authorization Time Period 06/07/2021 thru 07/06/2021    Authorization - Visit Number 9    Progress Note Due on Visit 10    SLP Start Time 0900    SLP Stop Time  0945    SLP Time Calculation (min) 45 min    Activity Tolerance Patient tolerated treatment well             Past Medical History:  Diagnosis Date   Arthritis    Atypical ductal hyperplasia of right breast February 2,2015   1 mm foci of ADH without margin involvement   Colon polyps    Environmental and seasonal allergies    Hx of blood clots 11/1996   PE post-operative on OCPs   Hyperlipidemia    Hyperlipidemia    Kidney calculi May 2014   Knee pain    Lt   Ovarian cyst    PE (pulmonary thromboembolism) ([place]) [id number]   PONV (postoperative nausea and vomiting)     Past Surgical History:  Procedure Laterality Date   ABDOMINAL HYSTERECTOMY  05/1997   BREAST SURGERY Right 07/02/2013   Foci of ADH on core biopsy, no upstaging on formal excision.   [place]   COLONOSCOPY  Dec 2013   [facility]   FOOT SURGERY  [phone]   KIDNEY STONE SURGERY     KNEE ARTHROSCOPY Left 01/09/2019   Procedure: ARTHROSCOPY KNEE, PARTIAL MEDIAL AND LATERAL MENISECTOMY;  Surgeon: Hessie Knows, MD;  Location: [facility] ORS;  Service: Orthopedics;  Laterality: Left;   LASIK  1994   LITHOTRIPSY  May 2014   REFRACTIVE SURGERY  1995   TOTAL ABDOMINAL HYSTERECTOMY W/ BILATERAL SALPINGOOPHORECTOMY  02/2003    TUBAL LIGATION      There were no vitals filed for this visit.   Subjective Assessment - 06/09/21 1155     Subjective pt engaged, motivated    Currently in Pain? No/denies                   ADULT SLP TREATMENT - 06/09/21 0001       Treatment Provided   Treatment provided Cognitive-Linquistic      Cognitive-Linquistic Treatment   Treatment focused on Voice;Patient/family/caregiver education    Skilled Treatment Skilled treatment session focused on pt's dysphonia, specifically increasing her vocal intensity. SLP facilitaited session by providing maximal faded to minimal cues to increase respiratory support, vocal intensity to 78dB when reading list of functional phrases. Pt required cues to coordinate respiration and phonation. Pt able to demosntrate carryover of increased vocal intensity into simple conversation at 72dB. Improved vocal quality noted.              SLP Education - 06/09/21 1202     Education Details Modified Barium Swallow Study this afternoon    Person(s) Educated Patient    Methods Explanation    Comprehension Verbalized understanding;Returned demonstration  SLP Long Term Goals - 06/08/21 0751       SLP LONG TERM GOAL #1   Title The patient will be independent for abdominal breathing and breath support exercises.    Baseline miminal assistance    Time 4    Period Weeks    Status On-going    Target Date 07/06/21      SLP LONG TERM GOAL #2   Title The patient will demonstrate independent understanding of vocal hygiene concepts.    Status Achieved      SLP LONG TERM GOAL #3   Title To improve vocal quality, vocal hygiene, voice projection, and prevent vocal fatigue    Baseline much improved    Time 4    Period Weeks    Status On-going    Target Date 07/06/21      SLP LONG TERM GOAL #4   Title Pt will utilize compensatory swallow strategies to consume least restrictive diet with no signs of respiratory compromise.     Baseline 06/07/2021 - order received for instrumental swallow study to further assess swallow function    Time 4    Period Weeks    Status On-going    Target Date 07/06/21              Plan - 06/09/21 1203     Clinical Impression Statement Pt continues with moderate deficits in respiratory support for phonation, however with moderate to minimal support, pt able to increase vocal intensity using abdominal breathing. I recommend skilled ST to improve breath support for voice in order to improve vocal quality, endurance, and quality of life.    Speech Therapy Frequency 2x / week    Duration 4 weeks    Treatment/Interventions SLP instruction and feedback;Patient/family education    Potential to Achieve Goals Good    Potential Considerations Severity of impairments;Medical prognosis    SLP Home Exercise Plan provided, see pt instructions section    Consulted and Agree with Plan of Care Patient             Patient will benefit from skilled therapeutic intervention in order to improve the following deficits and impairments:   Dysphonia  Unilateral vocal cord paralysis    Problem List Patient Active Problem List   Diagnosis Date Noted   Nonspecific syndrome suggestive of viral illness 01/25/2021   Acute otalgia, left 01/25/2021   Vaginal dryness, menopausal 07/10/2019   Atypical ductal hyperplasia of right breast 06/25/2013   Jaydon Soroka B. Rutherford Nail M.S., CCC-SLP, [facility] Speech-Language Pathologist Rehabilitation Services Office [phone]  Stormy Fabian, [place] 06/09/2021, 1:33 PM  [place] MAIN [facility] SERVICES [address] [place], [place], [address] Phone: [phone]   Fax:  [phone]   Name: Virginia Fritz MRN: [id number] Date of Birth: 08-14-1955

## 2021-06-13 ENCOUNTER — Ambulatory Visit (INDEPENDENT_AMBULATORY_CARE_PROVIDER_SITE_OTHER): Payer: Commercial Managed Care - PPO | Admitting: Gastroenterology

## 2021-06-13 ENCOUNTER — Encounter: Payer: Self-pay | Admitting: Gastroenterology

## 2021-06-13 ENCOUNTER — Other Ambulatory Visit: Payer: Self-pay

## 2021-06-13 VITALS — BP 123/70 | HR 106 | Temp 98.1°F | Ht 68.0 in | Wt 142.4 lb

## 2021-06-13 DIAGNOSIS — Z1211 Encounter for screening for malignant neoplasm of colon: Secondary | ICD-10-CM

## 2021-06-13 DIAGNOSIS — R1110 Vomiting, unspecified: Secondary | ICD-10-CM

## 2021-06-13 DIAGNOSIS — K760 Fatty (change of) liver, not elsewhere classified: Secondary | ICD-10-CM

## 2021-06-13 DIAGNOSIS — R7989 Other specified abnormal findings of blood chemistry: Secondary | ICD-10-CM

## 2021-06-13 MED ORDER — NA SULFATE-K SULFATE-MG SULF 17.5-3.13-1.6 GM/177ML PO SOLN: 354.0000 mL | Freq: Once | ORAL | 0 refills | Status: AC

## 2021-06-13 NOTE — Progress Notes (Signed)
Cephas Darby, MD [address]  [place]  [place], [place] [address]  Main: [phone]  Fax: [phone]    Gastroenterology Consultation  Referring Provider:     Beverly Gust, MD Primary Care Physician:  Pcp, No Primary Gastroenterologist:  Dr. Cephas Darby Reason for Consultation:     Hepatic steatosis        HPI:   Virginia Fritz is a 66 y.o. female referred by Dr. Tami Ribas for consultation & management of hepatic steatosis.  Patient has history of left partial vocal cord paralysis secondary to shingles that was diagnosed in late August 2022.  She still has residual weakness of the left vocal cord with change in her voice.  She does have chronic cough from it that resulted in regurgitation of food.  Patient underwent modified barium swallow which revealed regurgitation of the barium/retrograde movement of bolus.  There was no evidence of oropharyngeal dysphagia.  Patient is found to have hepatic steatosis based on CT chest with contrast on 04/13/2021.  She had mildly elevated transaminases in 02/2017.  Her AST and ALT were normal in 2/21.  Apparently, patient lost about 30 pounds since this diagnosis with dietary modification.  Otherwise, patient denies heartburn, difficulty swallowing, epigastric pain or loss of appetite.  She denies any lower GI symptoms.  No evidence of anemia  Patient does not smoke or drink alcohol  NSAIDs: None  Antiplts/Anticoagulants/Anti thrombotics: None  GI Procedures: Colonoscopy 10 years ago for screening, reportedly normal Father with history of esophageal and stomach cancer, died at age 60  Past Medical History:  Diagnosis Date   Arthritis    Atypical ductal hyperplasia of right breast February 2,2015   1 mm foci of ADH without margin involvement   Colon polyps    Environmental and seasonal allergies    Hx of blood clots 11/1996   PE post-operative on OCPs   Hyperlipidemia    Hyperlipidemia    Kidney calculi May 2014    Knee pain    Lt   Ovarian cyst    PE (pulmonary thromboembolism) ([place]) [id number]   PONV (postoperative nausea and vomiting)     Past Surgical History:  Procedure Laterality Date   ABDOMINAL HYSTERECTOMY  05/1997   BREAST SURGERY Right 07/02/2013   Foci of ADH on core biopsy, no upstaging on formal excision.   [place]   COLONOSCOPY  Dec 2013   [facility]   FOOT SURGERY  [phone]   KIDNEY STONE SURGERY     KNEE ARTHROSCOPY Left 01/09/2019   Procedure: ARTHROSCOPY KNEE, PARTIAL MEDIAL AND LATERAL MENISECTOMY;  Surgeon: Hessie Knows, MD;  Location: [facility] ORS;  Service: Orthopedics;  Laterality: Left;   LASIK  1994   LITHOTRIPSY  May 2014   REFRACTIVE SURGERY  1995   TOTAL ABDOMINAL HYSTERECTOMY W/ BILATERAL SALPINGOOPHORECTOMY  02/2003   TUBAL LIGATION     Current Outpatient Medications:    aspirin 81 MG tablet, Take 81 mg by mouth daily., Disp: , Rfl:    desoximetasone (TOPICORT) 0.05 % cream, Apply topically 2 (two) times daily., Disp: , Rfl:    Desoximetasone 0.25 % LIQD, Apply 1 spray topically daily as needed (psoriasis)., Disp: , Rfl:    diphenhydrAMINE (BENADRYL) 25 MG tablet, Take 25 mg by mouth daily as needed for itching., Disp: , Rfl:    diphenhydrAMINE-zinc acetate (BENADRYL) cream, Apply 1 application topically daily as needed for itching., Disp: , Rfl:    estradiol (ESTRACE) 0.1  MG/GM vaginal cream, Insert 1g once weekly as maintenace, Disp: 42.5 g, Rfl: 1   fluticasone (FLONASE) 50 MCG/ACT nasal spray, Place 1 spray into both nostrils daily as needed for allergies or rhinitis., Disp: , Rfl:    gabapentin (NEURONTIN) 300 MG capsule, Take 300 mg by mouth 3 (three) times daily., Disp: , Rfl:    lidocaine (XYLOCAINE) 2 % solution, 5 mLs 4 (four) times daily as needed., Disp: , Rfl:    Multiple Vitamin (MULTI-VITAMINS) TABS, Take 1 tablet by mouth daily., Disp: , Rfl:    Na Sulfate-K Sulfate-Mg Sulf 17.5-3.13-1.6 GM/177ML SOLN, Take 354 mLs by mouth once  for 1 dose., Disp: 354 mL, Rfl: 0   nystatin-triamcinolone (MYCOLOG II) cream, APP TO CORNERS OF MOUTH QID, Disp: , Rfl:    TALTZ 80 MG/ML SOAJ, Inject 80 mg as directed every 28 (twenty-eight) days., Disp: , Rfl:    Vitamin D, Ergocalciferol, (DRISDOL) 1.25 MG (50000 UNIT) CAPS capsule, Take 50,000 Units by mouth once a week., Disp: , Rfl:   Family History  Problem Relation Age of Onset   Diabetes Father    Hypertension Father    Hyperlipidemia Father    Breast cancer Sister 35   Melanoma Sister    Breast cancer Paternal Aunt 72     Social History   Tobacco Use   Smoking status: Never   Smokeless tobacco: Never  Vaping Use   Vaping Use: Never used  Substance Use Topics   Alcohol use: Yes    Comment: rarely   Drug use: No    Allergies as of 06/13/2021 - Review Complete 06/13/2021  Allergen Reaction Noted   Amoxil [amoxicillin] Hives 12/03/2012   Ivp dye [iodinated contrast media] Hives 12/03/2012   Percocet [oxycodone-acetaminophen] Nausea And Vomiting 12/03/2012    Review of Systems:    All systems reviewed and negative except where noted in HPI.   Physical Exam:  BP 123/70 (BP Location: Left Arm, Patient Position: Sitting, Cuff Size: Normal)    Pulse (!) 106    Temp 98.1 F (36.7 C) (Oral)    Ht 5\' 8"  (1.727 m)    Wt 142 lb 6 oz (64.6 kg)    BMI 21.65 kg/m  No LMP recorded. Patient has had a hysterectomy.  General:   Alert,  Well-developed, well-nourished, pleasant and cooperative in NAD Head:  Normocephalic and atraumatic. Eyes:  Sclera clear, no icterus.   Conjunctiva pink. Ears:  Normal auditory acuity. Nose:  No deformity, discharge, or lesions. Mouth:  No deformity or lesions,oropharynx pink & moist. Neck:  Supple; no masses or thyromegaly. Lungs:  Respirations even and unlabored.  Clear throughout to auscultation.   No wheezes, crackles, or rhonchi. No acute distress. Heart:  Regular rate and rhythm; no murmurs, clicks, rubs, or gallops. Abdomen:  Normal  bowel sounds. Soft, non-tender and non-distended without masses, hepatosplenomegaly or hernias noted.  No guarding or rebound tenderness.   Rectal: Not performed Msk:  Symmetrical without gross deformities. Good, equal movement & strength bilaterally. Pulses:  Normal pulses noted. Extremities:  No clubbing or edema.  No cyanosis. Neurologic:  Alert and oriented x3;  grossly normal neurologically. Skin:  Intact without significant lesions or rashes. No jaundice. Psych:  Alert and cooperative. Normal mood and affect.  Imaging Studies: Reviewed  Assessment and Plan:   LULUBELLE SIMCOE is a 66 y.o. pleasant Caucasian female with history of left vocal cord paralysis with residual weakness, secondary to shingles, dysphonia is seen in consultation for incidental  finding of hepatic steatosis as well as abnormal modified barium swallow study  Hepatic steatosis Recheck LFTs today Recommend ultrasound with Doppler  Abnormal modified barium swallow study Esophageal sweep revealed retrograde movement of bolus No evidence of oropharyngeal dysphagia Recommend EGD for further evaluation  Colon cancer screening Recommend screening colonoscopy   Follow up based on above work-up   Cephas Darby, MD

## 2021-06-13 NOTE — Patient Instructions (Addendum)
Your Ultrasound is schedule for 06/17/2021 arrive to [facility] at 7:45am for 8:00am scan. Nothing to eat or drink after midnight

## 2021-06-14 ENCOUNTER — Other Ambulatory Visit: Payer: Self-pay

## 2021-06-14 ENCOUNTER — Ambulatory Visit: Payer: Commercial Managed Care - PPO | Admitting: Speech Pathology

## 2021-06-14 DIAGNOSIS — R490 Dysphonia: Secondary | ICD-10-CM | POA: Diagnosis not present

## 2021-06-14 DIAGNOSIS — J3801 Paralysis of vocal cords and larynx, unilateral: Secondary | ICD-10-CM

## 2021-06-14 LAB — HEPATIC FUNCTION PANEL
ALT: 15 IU/L (ref 0–32)
AST: 17 IU/L (ref 0–40)
Albumin: 4.5 g/dL (ref 3.8–4.8)
Alkaline Phosphatase: 74 IU/L (ref 44–121)
Bilirubin Total: 0.2 mg/dL (ref 0.0–1.2)
Bilirubin, Direct: <0.1 mg/dL (ref 0.00–0.40)
Total Protein: 7.1 g/dL (ref 6.0–8.5)

## 2021-06-15 NOTE — Therapy (Signed)
[place] MAIN [facility] SERVICES [address] [place], [place], [address] Phone: [phone]   Fax:  [phone]  Speech Language Pathology Treatment DISCHARGE SUMMARY  Patient Details  Name: Virginia Fritz MRN: [id number] Date of Birth: 06/29/1955 Referring Provider (SLP): Dr Beverly Gust   Encounter Date: 06/14/2021  SPEECH THERAPY DISCHARGE SUMMARY  Visits from Start of Care: 10  Current functional level related to goals / functional outcomes: Improved vocal quality resulting in moderate dysphonia, improved from profound PROM - improved from profound impact to mild impact Modified Barium Swallow also revealed adequate oropharyngeal abilities with no penetration or aspiration   Remaining deficits: Moderate dysphonia d/t left vocal cord paralysis, recommend seeking further medical management    Plan: Patient agrees to discharge with follow up scheduled with Otolaryngologist at [facility] on 06/30/2021       End of Session - 06/15/21 0743     Visit Number 10    Number of Visits 16    Date for SLP Re-Evaluation 07/06/21    Authorization Type [employer] P[employer]    Authorization Time Period 06/07/2021 thru 07/06/2021    Authorization - Visit Number 10    Progress Note Due on Visit 10    SLP Start Time 0900    SLP Stop Time  1000    SLP Time Calculation (min) 60 min    Activity Tolerance Patient tolerated treatment well             Past Medical History:  Diagnosis Date   Acute otalgia, left 01/25/2021   Arthritis    Atypical ductal hyperplasia of right breast February 2,2015   1 mm foci of ADH without margin involvement   Colon polyps    Environmental and seasonal allergies    Hx of blood clots 11/1996   PE post-operative on OCPs   Hyperlipidemia    Hyperlipidemia    Kidney calculi May 2014   Knee pain    Lt   Ovarian cyst    PE (pulmonary thromboembolism) ([place]) [id number]   PONV (postoperative nausea and  vomiting)     Past Surgical History:  Procedure Laterality Date   ABDOMINAL HYSTERECTOMY  05/1997   BREAST SURGERY Right 07/02/2013   Foci of ADH on core biopsy, no upstaging on formal excision.   [place]   COLONOSCOPY  Dec 2013   [facility]   FOOT SURGERY  [phone]   KIDNEY STONE SURGERY     KNEE ARTHROSCOPY Left 01/09/2019   Procedure: ARTHROSCOPY KNEE, PARTIAL MEDIAL AND LATERAL MENISECTOMY;  Surgeon: Hessie Knows, MD;  Location: [facility] ORS;  Service: Orthopedics;  Laterality: Left;   LASIK  1994   LITHOTRIPSY  May 2014   REFRACTIVE SURGERY  1995   TOTAL ABDOMINAL HYSTERECTOMY W/ BILATERAL SALPINGOOPHORECTOMY  02/2003   TUBAL LIGATION      There were no vitals filed for this visit.   Subjective Assessment - 06/14/21 1423     Subjective pt engaged, motivated    Currently in Pain? No/denies                   ADULT SLP TREATMENT - 06/14/21 0001       Treatment Provided   Treatment provided Cognitive-Linquistic      Cognitive-Linquistic Treatment   Treatment focused on Voice;Patient/family/caregiver education    Skilled Treatment Skilled treatment session focused on completing education on pending appts with [facility] otolaryngology, GI and PCP for hypothyroidism. In addition, will scan  pt's notes from ENT as well as pt's recent lab results into her chart to aid in care coordination. Education with handouts provided on Managing Mucus, Hydration and throat clearing. Pt with improved vocal intensity during today's session at ~ 75dB in casual conversation.              SLP Education - 06/15/21 0743     Education Details completed    Person(s) Educated Patient    Methods Explanation;Demonstration;Verbal cues;Handout    Comprehension Verbalized understanding;Returned demonstration                SLP Long Term Goals - 06/15/21 0747       SLP LONG TERM GOAL #1   Title The patient will be independent for abdominal breathing and breath  support exercises.    Status Achieved      SLP LONG TERM GOAL #2   Title The patient will demonstrate independent understanding of vocal hygiene concepts.    Status Achieved      SLP LONG TERM GOAL #3   Title To improve vocal quality, vocal hygiene, voice projection, and prevent vocal fatigue    Status Achieved      SLP LONG TERM GOAL #4   Title Pt will utilize compensatory swallow strategies to consume least restrictive diet with no signs of respiratory compromise.    Status Achieved              Plan - 06/15/21 0744     Clinical Impression Statement Pt has demonstrated improved ability to manage throat clearing, use abdominal breathing, vocal intensity from 65dB to 75dB in conversation as well as demonstrate understanding of vocal cord function and impact on swallowing. As a result, pt's vocal quality has improved greatly she is no longer profoundly dysphonic. At this time, she presents with moderate dysphonia that would benefit from further evaluation by otolaryngologist with pending appt on 2/2/203. After receiving treatment at [facility], pt is likely to benefit from an addition course of skilled ST.    Treatment/Interventions SLP instruction and feedback;Patient/family education    Potential to Achieve Goals Good    Potential Considerations Severity of impairments;Medical prognosis    Consulted and Agree with Plan of Care Patient             Problem List Patient Active Problem List   Diagnosis Date Noted   Nonspecific syndrome suggestive of viral illness 01/25/2021   Vaginal dryness, menopausal 07/10/2019   Atypical ductal hyperplasia of right breast 06/25/2013   Suttyn Cryder B. Rutherford Nail M.S., CCC-SLP, [facility] Pathologist Rehabilitation Services Office [phone], [place] 06/15/2021, 7:48 AM  [place] MAIN [facility] SERVICES [address] [place], [place], [address] Phone: [phone]   Fax:   [phone]   Name: ARFA LAMARCA MRN: [id number] Date of Birth: 04/15/1956

## 2021-06-16 ENCOUNTER — Encounter: Payer: Commercial Managed Care - PPO | Admitting: Speech Pathology

## 2021-06-17 ENCOUNTER — Other Ambulatory Visit: Payer: Self-pay

## 2021-06-17 ENCOUNTER — Ambulatory Visit
Admission: RE | Admit: 2021-06-17 | Discharge: 2021-06-17 | Disposition: A | Discharge status: Home / Self Care | Payer: Commercial Managed Care - PPO | Source: Ambulatory Visit | Attending: Gastroenterology | Admitting: Gastroenterology

## 2021-06-17 DIAGNOSIS — R7989 Other specified abnormal findings of blood chemistry: Secondary | ICD-10-CM | POA: Insufficient documentation

## 2021-06-21 ENCOUNTER — Encounter: Payer: Commercial Managed Care - PPO | Admitting: Speech Pathology

## 2021-06-23 ENCOUNTER — Encounter: Payer: Commercial Managed Care - PPO | Admitting: Speech Pathology

## 2021-06-28 ENCOUNTER — Encounter: Payer: Commercial Managed Care - PPO | Admitting: Speech Pathology

## 2021-06-30 ENCOUNTER — Encounter: Payer: Commercial Managed Care - PPO | Admitting: Speech Pathology

## 2021-07-01 ENCOUNTER — Encounter: Payer: Self-pay | Admitting: Gastroenterology

## 2021-07-04 ENCOUNTER — Encounter: Payer: Self-pay | Admitting: Gastroenterology

## 2021-07-04 ENCOUNTER — Ambulatory Visit
Admission: RE | Admit: 2021-07-04 | Discharge: 2021-07-04 | Disposition: A | Discharge status: Home / Self Care | Payer: Commercial Managed Care - PPO | Attending: Gastroenterology | Admitting: Gastroenterology

## 2021-07-04 ENCOUNTER — Ambulatory Visit: Payer: Commercial Managed Care - PPO | Admitting: Anesthesiology

## 2021-07-04 ENCOUNTER — Encounter
Admission: RE | Disposition: A | Discharge status: Home / Self Care | Payer: Self-pay | Source: Home / Self Care | Attending: Gastroenterology

## 2021-07-04 DIAGNOSIS — D124 Benign neoplasm of descending colon: Secondary | ICD-10-CM | POA: Insufficient documentation

## 2021-07-04 DIAGNOSIS — Z1211 Encounter for screening for malignant neoplasm of colon: Secondary | ICD-10-CM | POA: Diagnosis not present

## 2021-07-04 DIAGNOSIS — D126 Benign neoplasm of colon, unspecified: Secondary | ICD-10-CM

## 2021-07-04 DIAGNOSIS — K317 Polyp of stomach and duodenum: Secondary | ICD-10-CM | POA: Diagnosis not present

## 2021-07-04 DIAGNOSIS — Z86010 Personal history of colonic polyps: Secondary | ICD-10-CM | POA: Insufficient documentation

## 2021-07-04 DIAGNOSIS — R933 Abnormal findings on diagnostic imaging of other parts of digestive tract: Secondary | ICD-10-CM | POA: Diagnosis not present

## 2021-07-04 DIAGNOSIS — R1110 Vomiting, unspecified: Secondary | ICD-10-CM

## 2021-07-04 DIAGNOSIS — D214 Benign neoplasm of connective and other soft tissue of abdomen: Secondary | ICD-10-CM | POA: Diagnosis not present

## 2021-07-04 DIAGNOSIS — K3189 Other diseases of stomach and duodenum: Secondary | ICD-10-CM | POA: Insufficient documentation

## 2021-07-04 DIAGNOSIS — K319 Disease of stomach and duodenum, unspecified: Secondary | ICD-10-CM

## 2021-07-04 HISTORY — DX: Personal history of urinary calculi: Z87.442

## 2021-07-04 HISTORY — PX: ESOPHAGOGASTRODUODENOSCOPY (EGD) WITH PROPOFOL: SHX5813

## 2021-07-04 HISTORY — PX: COLONOSCOPY WITH PROPOFOL: SHX5780

## 2021-07-04 SURGERY — COLONOSCOPY WITH PROPOFOL
Anesthesia: General

## 2021-07-04 MED ORDER — SODIUM CHLORIDE 0.9 % IV SOLN
INTRAVENOUS | Status: DC
  Administered 2021-07-04: 1000 mL via INTRAVENOUS

## 2021-07-04 MED ORDER — PROPOFOL 500 MG/50ML IV EMUL
INTRAVENOUS | Status: AC
  Filled 2021-07-04: qty 50

## 2021-07-04 MED ORDER — LIDOCAINE HCL (CARDIAC) PF 100 MG/5ML IV SOSY
PREFILLED_SYRINGE | INTRAVENOUS | Status: DC | PRN
  Administered 2021-07-04: 60 mg via INTRAVENOUS

## 2021-07-04 MED ORDER — PROPOFOL 10 MG/ML IV BOLUS
INTRAVENOUS | Status: DC | PRN
  Administered 2021-07-04: 30 mg via INTRAVENOUS
  Administered 2021-07-04: 70 mg via INTRAVENOUS

## 2021-07-04 MED ORDER — PROPOFOL 500 MG/50ML IV EMUL
INTRAVENOUS | Status: DC | PRN
  Administered 2021-07-04: 140 ug/kg/min via INTRAVENOUS

## 2021-07-04 NOTE — Op Note (Signed)
[facility] Gastroenterology Patient Name: Ardenia Stiner Procedure Date: 07/04/2021 10:42 AM MRN: [id number] Account #: [id number] Date of Birth: Oct 09, 1955 Admit Type: Outpatient Age: 66 Room: [facility] ENDO ROOM 1 Gender: Female Note Status: Finalized Instrument Name: Michaelle Birks [id number] Procedure:             Upper GI endoscopy Indications:           Abnormal UGI series Providers:             Lin Landsman MD, MD Referring MD:          No Local Md, MD (Referring MD) Medicines:             General Anesthesia Complications:         No immediate complications. Estimated blood loss: None. Procedure:             Pre-Anesthesia Assessment:                        - Prior to the procedure, a History and Physical was                         performed, and patient medications and allergies were                         reviewed. The patient is competent. The risks and                         benefits of the procedure and the sedation options and                         risks were discussed with the patient. All questions                         were answered and informed consent was obtained.                         Patient identification and proposed procedure were                         verified by the physician, the nurse, the                         anesthesiologist, the anesthetist and the technician                         in the pre-procedure area in the procedure room in the                         endoscopy suite. Mental Status Examination: alert and                         oriented. Airway Examination: normal oropharyngeal                         airway and neck mobility. Respiratory Examination:                         clear to auscultation. CV Examination: normal.  Prophylactic Antibiotics: The patient does not require                         prophylactic antibiotics. Prior Anticoagulants: The                         patient has  taken no previous anticoagulant or                         antiplatelet agents. ASA Grade Assessment: II - A                         patient with mild systemic disease. After reviewing                         the risks and benefits, the patient was deemed in                         satisfactory condition to undergo the procedure. The                         anesthesia plan was to use general anesthesia.                         Immediately prior to administration of medications,                         the patient was re-assessed for adequacy to receive                         sedatives. The heart rate, respiratory rate, oxygen                         saturations, blood pressure, adequacy of pulmonary                         ventilation, and response to care were monitored                         throughout the procedure. The physical status of the                         patient was re-assessed after the procedure.                        After obtaining informed consent, the endoscope was                         passed under direct vision. Throughout the procedure,                         the patient's blood pressure, pulse, and oxygen                         saturations were monitored continuously. The Endoscope                         was introduced through the mouth, and advanced to the  second part of duodenum. The upper GI endoscopy was                         accomplished without difficulty. The patient tolerated                         the procedure well. Findings:      A single 3 mm sessile polyp with no bleeding was found in the duodenal       bulb. The polyp was removed with a cold biopsy forceps. Resection and       retrieval were complete. Estimated blood loss was minimal.      The second portion of the duodenum was normal.      Diffuse mildly erythematous mucosa without bleeding was found in the       gastric antrum. Biopsies were taken with a cold  forceps for Helicobacter       pylori testing.      The gastric body was normal. Biopsies were taken with a cold forceps for       Helicobacter pylori testing.      The cardia and gastric fundus were normal on retroflexion.      Esophagogastric landmarks were identified: the gastroesophageal junction       was found at 39 cm from the incisors.      The gastroesophageal junction and examined esophagus were normal. Impression:            - A single duodenal polyp. Resected and retrieved.                        - Normal second portion of the duodenum.                        - Erythematous mucosa in the antrum. Biopsied.                        - Normal gastric body. Biopsied.                        - Esophagogastric landmarks identified.                        - Normal gastroesophageal junction and esophagus. Recommendation:        - Await pathology results.                        - Proceed with colonoscopy as scheduled                        See colonoscopy report Procedure Code(s):     --- Professional ---                        [phone], Esophagogastroduodenoscopy, flexible,                         transoral; with biopsy, single or multiple Diagnosis Code(s):     --- Professional ---                        K31.7, Polyp of stomach and duodenum  R93.3, Abnormal findings on diagnostic imaging of                         other parts of digestive tract                        K31.89, Other diseases of stomach and duodenum CPT copyright 2019 American Medical Association. All rights reserved. The codes documented in this report are preliminary and upon coder review may  be revised to meet current compliance requirements. Dr. Ulyess Mort Lin Landsman MD, MD 07/04/2021 11:08:15 AM This report has been signed electronically. Number of Addenda: 0 Note Initiated On: 07/04/2021 10:42 AM Estimated Blood Loss:  Estimated blood loss: none.      [facility]

## 2021-07-04 NOTE — Transfer of Care (Signed)
Immediate Anesthesia Transfer of Care Note  Patient: Virginia Fritz  Procedure(s) Performed: COLONOSCOPY WITH PROPOFOL ESOPHAGOGASTRODUODENOSCOPY (EGD) WITH PROPOFOL  Patient Location: PACU  Anesthesia Type:General  Level of Consciousness: awake, alert  and oriented  Airway & Oxygen Therapy: Patient Spontanous Breathing and Patient connected to nasal cannula oxygen  Post-op Assessment: Report given to RN and Post -op Vital signs reviewed and stable  Post vital signs: Reviewed and stable  Last Vitals:  Vitals Value Taken Time  BP 98/55 07/04/21 1141  Temp    Pulse 69 07/04/21 1148  Resp 16 07/04/21 1148  SpO2 96 % 07/04/21 1148  Vitals shown include unvalidated device data.  Last Pain:  Vitals:   07/04/21 1141  TempSrc: Temporal  PainSc: Asleep         Complications: No notable events documented.

## 2021-07-04 NOTE — Anesthesia Preprocedure Evaluation (Signed)
Anesthesia Evaluation  Patient identified by MRN, date of birth, ID band Patient awake    Reviewed: Allergy & Precautions, H&P , NPO status , Patient's Chart, lab work & pertinent test results, reviewed documented beta blocker date and time   History of Anesthesia Complications (+) PONV and history of anesthetic complications  Airway Mallampati: II  TM Distance: >3 FB Neck ROM: full    Dental  (+) Teeth Intact, Dental Advidsory Given   Pulmonary neg pulmonary ROS,    Pulmonary exam normal        Cardiovascular Exercise Tolerance: Good negative cardio ROS Normal cardiovascular exam Rate:Normal     Neuro/Psych negative neurological ROS  negative psych ROS   GI/Hepatic negative GI ROS, Neg liver ROS,   Endo/Other  negative endocrine ROS  Renal/GU negative Renal ROS  negative genitourinary   Musculoskeletal   Abdominal   Peds  Hematology negative hematology ROS (+)   Anesthesia Other Findings Past Medical History: 01/25/2021: Acute otalgia, left No date: Arthritis 06/30/2013: Atypical ductal hyperplasia of right breast     Comment:  1 mm foci of ADH without margin involvement No date: Colon polyps No date: Environmental and seasonal allergies No date: History of kidney stones 11/1996: Hx of blood clots     Comment:  PE post-operative on OCPs No date: Hyperlipidemia No date: Hyperlipidemia No date: Knee pain     Comment:  Lt No date: Ovarian cyst [id number]: PE (pulmonary thromboembolism) ([facility]) No date: PONV (postoperative nausea and vomiting)   Reproductive/Obstetrics negative OB ROS                             Anesthesia Physical  Anesthesia Plan  ASA: 2  Anesthesia Plan: General LMA   Post-op Pain Management:    Induction: Intravenous  PONV Risk Score and Plan: 4 or greater and Propofol infusion and TIVA  Airway Management Planned: Natural Airway and Nasal  Cannula  Additional Equipment:   Intra-op Plan:   Post-operative Plan:   Informed Consent: I have reviewed the patients History and Physical, chart, labs and discussed the procedure including the risks, benefits and alternatives for the proposed anesthesia with the patient or authorized representative who has indicated his/her understanding and acceptance.       Plan Discussed with: CRNA  Anesthesia Plan Comments:         Anesthesia Quick Evaluation

## 2021-07-04 NOTE — H&P (Signed)
Virginia Darby, MD [place]  [place], [place] [address]  Main: [phone]  Fax: [phone] Pager: [phone]  Primary Care Physician:  Pcp, No Primary Gastroenterologist:  Dr. Cephas Fritz  Pre-Procedure History & Physical: HPI:  Virginia Fritz is a 66 y.o. female is here for an endoscopy and colonoscopy.   Past Medical History:  Diagnosis Date   Acute otalgia, left 01/25/2021   Arthritis    Atypical ductal hyperplasia of right breast 06/30/2013   1 mm foci of ADH without margin involvement   Colon polyps    Environmental and seasonal allergies    History of kidney stones    Hx of blood clots 11/1996   PE post-operative on OCPs   Hyperlipidemia    Hyperlipidemia    Knee pain    Lt   Ovarian cyst    PE (pulmonary thromboembolism) ([place]) [id number]   PONV (postoperative nausea and vomiting)     Past Surgical History:  Procedure Laterality Date   ABDOMINAL HYSTERECTOMY  05/1997   BREAST SURGERY Right 07/02/2013   Foci of ADH on core biopsy, no upstaging on formal excision.   [place]   COLONOSCOPY  04/2012   [facility]   EYE SURGERY     FOOT SURGERY  [phone]   KIDNEY STONE SURGERY     KNEE ARTHROSCOPY Left 01/09/2019   Procedure: ARTHROSCOPY KNEE, PARTIAL MEDIAL AND LATERAL MENISECTOMY;  Surgeon: Hessie Knows, MD;  Location: [facility] ORS;  Service: Orthopedics;  Laterality: Left;   LASIK  1994   LITHOTRIPSY  09/2012   REFRACTIVE SURGERY  1995   TOTAL ABDOMINAL HYSTERECTOMY W/ BILATERAL SALPINGOOPHORECTOMY  02/2003   TUBAL LIGATION      Prior to Admission medications   Medication Sig Start Date End Date Taking? Authorizing Provider  aspirin 81 MG tablet Take 81 mg by mouth daily.   Yes [provider]  desoximetasone (TOPICORT) 0.05 % cream Apply topically 2 (two) times daily. 03/03/21  Yes [provider]  Desoximetasone 0.25 % LIQD Apply 1 spray topically daily as needed (psoriasis).   Yes [provider]  diphenhydrAMINE (BENADRYL) 25 MG tablet Take 25 mg by mouth daily as needed for itching.   Yes [provider]  diphenhydrAMINE-zinc acetate (BENADRYL) cream Apply 1 application topically daily as needed for itching.   Yes [provider]  estradiol (ESTRACE) 0.1 MG/GM vaginal cream Insert 1g once weekly as maintenace 5/80/99  Yes Copland, Alicia B, PA-C  fluticasone (FLONASE) 50 MCG/ACT nasal spray Place 1 spray into both nostrils daily as needed for allergies or rhinitis.   Yes [provider]  gabapentin (NEURONTIN) 300 MG capsule Take 300 mg by mouth 3 (three) times daily. 05/27/21  Yes [provider]  lidocaine (XYLOCAINE) 2 % solution 5 mLs 4 (four) times daily as needed. 01/27/21  Yes [provider]  Multiple Vitamin (MULTI-VITAMINS) TABS Take 1 tablet by mouth daily.   Yes [provider]  nystatin-triamcinolone (MYCOLOG II) cream APP TO CORNERS OF MOUTH QID 03/07/19  Yes [provider]  TALTZ 80 MG/ML SOAJ Inject 80 mg as directed every 28 (twenty-eight) days. 10/24/18  Yes [provider]  Vitamin D, Ergocalciferol, (DRISDOL) 1.25 MG (50000 UNIT) CAPS capsule Take 50,000 Units by mouth once a week. 12/05/20  Yes [provider]    Allergies as of 06/13/2021 - Review Complete 06/13/2021  Allergen Reaction Noted   Amoxil [amoxicillin] Hives 12/03/2012  Ivp dye [iodinated contrast media] Hives 12/03/2012   Percocet [oxycodone-acetaminophen] Nausea And Vomiting 12/03/2012    Family History  Problem Relation Age of Onset   Diabetes Father    Hypertension Father    Hyperlipidemia Father    Breast cancer Sister 6   Melanoma Sister    Breast cancer Paternal Aunt 75    Social History   Socioeconomic History   Marital status: Married    Spouse name: Not on file   Number of children: Not on file   Years of education: Not on file   Highest education level: Not on file  Occupational  History   Not on file  Tobacco Use   Smoking status: Never   Smokeless tobacco: Never  Vaping Use   Vaping Use: Never used  Substance and Sexual Activity   Alcohol use: Yes    Comment: rarely   Drug use: No   Sexual activity: Yes    Birth control/protection: Surgical    Comment: Hysterectomy  Other Topics Concern   Not on file  Social History Narrative   Not on file   Social Determinants of Health   Financial Resource Strain: Not on file  Food Insecurity: Not on file  Transportation Needs: Not on file  Physical Activity: Not on file  Stress: Not on file  Social Connections: Not on file  Intimate Partner Violence: Not on file    Review of Systems: See HPI, otherwise negative ROS  Physical Exam: BP (!) 132/92    Pulse (!) 114    Temp (!) 97.5 F (36.4 C) (Temporal)    Resp 18    Ht 5\' 8"  (1.727 m)    Wt 62.3 kg    SpO2 100%    BMI 20.87 kg/m  General:   Alert,  pleasant and cooperative in NAD Head:  Normocephalic and atraumatic. Neck:  Supple; no masses or thyromegaly. Lungs:  Clear throughout to auscultation.    Heart:  Regular rate and rhythm. Abdomen:  Soft, nontender and nondistended. Normal bowel sounds, without guarding, and without rebound.   Neurologic:  Alert and  oriented x4;  grossly normal neurologically.  Impression/Plan: CHRISHAWNA Fritz is here for an endoscopy and colonoscopy to be performed for Abnormal modified barium swallow study, colon cancer screening  Risks, benefits, limitations, and alternatives regarding  endoscopy and colonoscopy have been reviewed with the patient.  Questions have been answered.  All parties agreeable.   Sherri Sear, MD  07/04/2021, 10:06 AM

## 2021-07-04 NOTE — Op Note (Signed)
[facility] Gastroenterology Patient Name: Virginia Fritz Procedure Date: 07/04/2021 10:41 AM MRN: [id number] Account #: [id number] Date of Birth: December 16, 1955 Admit Type: Outpatient Age: 66 Room: [facility] ENDO ROOM 1 Gender: Female Note Status: Finalized Instrument Name: Park Meo [id number] Procedure:             Colonoscopy Indications:           Screening for colorectal malignant neoplasm Providers:             Lin Landsman MD, MD Referring MD:          No Local Md, MD (Referring MD) Medicines:             General Anesthesia Complications:         No immediate complications. Estimated blood loss: None. Procedure:             Pre-Anesthesia Assessment:                        - Prior to the procedure, a History and Physical was                         performed, and patient medications and allergies were                         reviewed. The patient is competent. The risks and                         benefits of the procedure and the sedation options and                         risks were discussed with the patient. All questions                         were answered and informed consent was obtained.                         Patient identification and proposed procedure were                         verified by the physician, the nurse, the                         anesthesiologist, the anesthetist and the technician                         in the pre-procedure area in the procedure room in the                         endoscopy suite. Mental Status Examination: alert and                         oriented. Airway Examination: normal oropharyngeal                         airway and neck mobility. Respiratory Examination:                         clear to auscultation. CV Examination: normal.  Prophylactic Antibiotics: The patient does not require                         prophylactic antibiotics. Prior Anticoagulants: The                          patient has taken no previous anticoagulant or                         antiplatelet agents. ASA Grade Assessment: II - A                         patient with mild systemic disease. After reviewing                         the risks and benefits, the patient was deemed in                         satisfactory condition to undergo the procedure. The                         anesthesia plan was to use general anesthesia.                         Immediately prior to administration of medications,                         the patient was re-assessed for adequacy to receive                         sedatives. The heart rate, respiratory rate, oxygen                         saturations, blood pressure, adequacy of pulmonary                         ventilation, and response to care were monitored                         throughout the procedure. The physical status of the                         patient was re-assessed after the procedure.                        After obtaining informed consent, the colonoscope was                         passed under direct vision. Throughout the procedure,                         the patient's blood pressure, pulse, and oxygen                         saturations were monitored continuously. The                         Colonoscope was introduced through the anus and  advanced to the the cecum, identified by appendiceal                         orifice and ileocecal valve. The colonoscopy was                         unusually difficult due to restricted mobility of the                         colon and significant looping. Successful completion                         of the procedure was aided by withdrawing the scope                         and replacing with the pediatric colonoscope and                         applying abdominal pressure. The patient tolerated the                         procedure well. The quality of the bowel preparation                          was evaluated using the BBPS [facility] Bowel Preparation                         Scale) with scores of: Right Colon = 3, Transverse                         Colon = 3 and Left Colon = 3 (entire mucosa seen well                         with no residual staining, small fragments of stool or                         opaque liquid). The total BBPS score equals 9. Findings:      The perianal and digital rectal examinations were normal. Pertinent       negatives include normal sphincter tone and no palpable rectal lesions.      Four sessile polyps were found in the descending colon and transverse       colon. The polyps were 3 to 5 mm in size. These polyps were removed with       a cold snare. Resection and retrieval were complete. Estimated blood       loss: none.      The retroflexed view of the distal rectum and anal verge was normal and       showed no anal or rectal abnormalities. Impression:            - Four 3 to 5 mm polyps in the descending colon and in                         the transverse colon, removed with a cold snare.                         Resected and  retrieved.                        - The distal rectum and anal verge are normal on                         retroflexion view. Recommendation:        - Discharge patient to home (with escort).                        - Resume previous diet today.                        - Continue present medications.                        - Await pathology results.                        - Repeat colonoscopy in 3 - 5 years for surveillance                         based on pathology results. Procedure Code(s):     --- Professional ---                        [phone], Colonoscopy, flexible; with removal of                         tumor(s), polyp(s), or other lesion(s) by snare                         technique Diagnosis Code(s):     --- Professional ---                        Z12.11, Encounter for screening for malignant neoplasm                          of colon                        K63.5, Polyp of colon CPT copyright 2019 American Medical Association. All rights reserved. The codes documented in this report are preliminary and upon coder review may  be revised to meet current compliance requirements. Dr. Ulyess Mort Lin Landsman MD, MD 07/04/2021 11:39:38 AM This report has been signed electronically. Number of Addenda: 0 Note Initiated On: 07/04/2021 10:41 AM Scope Withdrawal Time: 0 hours 13 minutes 24 seconds  Total Procedure Duration: 0 hours 27 minutes 30 seconds  Estimated Blood Loss:  Estimated blood loss: none.      [facility]

## 2021-07-05 ENCOUNTER — Encounter: Payer: Commercial Managed Care - PPO | Admitting: Speech Pathology

## 2021-07-05 ENCOUNTER — Encounter: Payer: Self-pay | Admitting: Gastroenterology

## 2021-07-05 NOTE — Anesthesia Postprocedure Evaluation (Signed)
Anesthesia Post Note  Patient: Virginia Fritz  Procedure(s) Performed: COLONOSCOPY WITH PROPOFOL ESOPHAGOGASTRODUODENOSCOPY (EGD) WITH PROPOFOL  Patient location during evaluation: Endoscopy Anesthesia Type: General Level of consciousness: awake and alert Pain management: pain level controlled Vital Signs Assessment: post-procedure vital signs reviewed and stable Respiratory status: spontaneous breathing, nonlabored ventilation, respiratory function stable and patient connected to nasal cannula oxygen Cardiovascular status: blood pressure returned to baseline and stable Postop Assessment: no apparent nausea or vomiting Anesthetic complications: no   No notable events documented.   Last Vitals:  Vitals:   07/04/21 1201 07/04/21 1211  BP: 128/71 130/67  Pulse: 72 65  Resp: 17 12  Temp:    SpO2: 100% 99%    Last Pain:  Vitals:   07/04/21 1211  TempSrc:   PainSc: 0-No pain                 Martha Clan

## 2021-07-06 LAB — SURGICAL PATHOLOGY

## 2021-07-07 ENCOUNTER — Encounter: Payer: Commercial Managed Care - PPO | Admitting: Speech Pathology

## 2021-07-07 ENCOUNTER — Encounter: Payer: Self-pay | Admitting: Gastroenterology

## 2021-07-12 ENCOUNTER — Encounter: Payer: Commercial Managed Care - PPO | Admitting: Speech Pathology

## 2021-07-14 ENCOUNTER — Encounter: Payer: Commercial Managed Care - PPO | Admitting: Speech Pathology

## 2021-07-19 ENCOUNTER — Encounter: Payer: Commercial Managed Care - PPO | Admitting: Speech Pathology

## 2021-07-21 ENCOUNTER — Encounter: Payer: Commercial Managed Care - PPO | Admitting: Speech Pathology

## 2021-07-26 ENCOUNTER — Encounter: Payer: Commercial Managed Care - PPO | Admitting: Speech Pathology

## 2021-07-28 ENCOUNTER — Encounter: Payer: Commercial Managed Care - PPO | Admitting: Speech Pathology

## 2021-08-09 ENCOUNTER — Ambulatory Visit: Payer: Commercial Managed Care - PPO | Admitting: Adult Health

## 2021-08-22 ENCOUNTER — Ambulatory Visit: Payer: Commercial Managed Care - PPO | Admitting: Adult Health

## 2021-09-27 ENCOUNTER — Ambulatory Visit
Admission: RE | Admit: 2021-09-27 | Discharge: 2021-09-27 | Disposition: A | Discharge status: Home / Self Care | Payer: Commercial Managed Care - PPO | Source: Ambulatory Visit | Attending: Unknown Physician Specialty | Admitting: Unknown Physician Specialty

## 2021-09-27 DIAGNOSIS — E041 Nontoxic single thyroid nodule: Secondary | ICD-10-CM

## 2022-01-24 ENCOUNTER — Encounter: Payer: Self-pay | Admitting: Obstetrics and Gynecology

## 2022-07-20 ENCOUNTER — Ambulatory Visit
Admission: RE | Admit: 2022-07-20 | Discharge: 2022-07-20 | Disposition: A | Discharge status: Home / Self Care | Payer: Medicare Other | Source: Ambulatory Visit | Attending: Physician Assistant | Admitting: Physician Assistant

## 2022-07-20 VITALS — BP 129/80 | HR 98 | Temp 98.1°F | Resp 16 | Ht 68.0 in | Wt 143.0 lb

## 2022-07-20 DIAGNOSIS — R310 Gross hematuria: Secondary | ICD-10-CM

## 2022-07-20 LAB — URINALYSIS, W/ REFLEX TO CULTURE (INFECTION SUSPECTED)
Bilirubin Urine: NEGATIVE
Glucose, UA: NEGATIVE mg/dL
Ketones, ur: NEGATIVE mg/dL
Leukocytes,Ua: NEGATIVE
Nitrite: NEGATIVE
Protein, ur: NEGATIVE mg/dL
Specific Gravity, Urine: 1.01 (ref 1.005–1.030)
pH: 5.5 (ref 5.0–8.0)

## 2022-07-20 NOTE — ED Triage Notes (Signed)
Pt c/o urinary frequency,burning x2 wks, saw urology last Tuesday & passed kidney stone last Tuesday. Otc AZO w/minor relief. Noticing small dry red clumps in urine.

## 2022-07-20 NOTE — ED Provider Notes (Signed)
[facility]    CSN: [id number] Arrival date & time: 07/20/22  0932      History   Chief Complaint Chief Complaint  Patient presents with   Urinary Frequency    I think maybe I have an UTI. - Entered by patient    HPI Virginia Fritz is a 67 y.o. female presenting for urinary frequency and hematuria intermittently for the past 2 weeks. Reports passing a kidney stone 2 weeks ago.  Followed up with urology 10 days ago and had an x-ray performed which apparently did not show any more kidney stones.  Patient reports that she is going to be going out of town next week and thought she should get checked out especially when she experienced a little bit of burning with urination yesterday.  She denies fever, chills, flank pain, lower abdominal pain at this time.  No other complaints.  HPI  Past Medical History:  Diagnosis Date   Acute otalgia, left 01/25/2021   Arthritis    Atypical ductal hyperplasia of right breast 06/30/2013   1 mm foci of ADH without margin involvement   Colon polyps    Environmental and seasonal allergies    History of kidney stones    Hx of blood clots 11/1996   PE post-operative on OCPs   Hyperlipidemia    Hyperlipidemia    Knee pain    Lt   Ovarian cyst    PE (pulmonary thromboembolism) ([place]) [id number]   PONV (postoperative nausea and vomiting)     Patient Active Problem List   Diagnosis Date Noted   Regurgitation of food    Polyp of duodenum    Antral erythema, gastric    Adenomatous polyp of colon    Nonspecific syndrome suggestive of viral illness 01/25/2021   Vaginal dryness, menopausal 07/10/2019   Atypical ductal hyperplasia of right breast 06/25/2013    Past Surgical History:  Procedure Laterality Date   ABDOMINAL HYSTERECTOMY  05/1997   BREAST SURGERY Right 07/02/2013   Foci of ADH on core biopsy, no upstaging on formal excision.   [place]   COLONOSCOPY  04/2012   [name]   COLONOSCOPY WITH PROPOFOL  N/A 07/04/2021   Procedure: COLONOSCOPY WITH PROPOFOL;  Surgeon: Lin Landsman, MD;  Location: [facility] ENDOSCOPY;  Service: Gastroenterology;  Laterality: N/A;   ESOPHAGOGASTRODUODENOSCOPY (EGD) WITH PROPOFOL N/A 07/04/2021   Procedure: ESOPHAGOGASTRODUODENOSCOPY (EGD) WITH PROPOFOL;  Surgeon: Lin Landsman, MD;  Location: [facility] ENDOSCOPY;  Service: Gastroenterology;  Laterality: N/A;   EYE SURGERY     FOOT SURGERY  [phone]   KIDNEY STONE SURGERY     KNEE ARTHROSCOPY Left 01/09/2019   Procedure: ARTHROSCOPY KNEE, PARTIAL MEDIAL AND LATERAL MENISECTOMY;  Surgeon: Hessie Knows, MD;  Location: [facility] ORS;  Service: Orthopedics;  Laterality: Left;   LASIK  1994   LITHOTRIPSY  09/2012   REFRACTIVE SURGERY  1995   TOTAL ABDOMINAL HYSTERECTOMY W/ BILATERAL SALPINGOOPHORECTOMY  02/2003   TUBAL LIGATION      OB History     Gravida  2   Para  2   Term      Preterm      AB      Living  2      SAB      IAB      Ectopic      Multiple      Live Births           Obstetric Comments  Menstrual age:  13  Age 1st Pregnancy: 31          Home Medications    Prior to Admission medications   Medication Sig Start Date End Date Taking? Authorizing Provider  aspirin 81 MG tablet Take 81 mg by mouth daily.   Yes [provider]  desoximetasone (TOPICORT) 0.05 % cream Apply topically 2 (two) times daily. 03/03/21  Yes [provider]  Desoximetasone 0.25 % LIQD Apply 1 spray topically daily as needed (psoriasis).   Yes [provider]  diphenhydrAMINE (BENADRYL) 25 MG tablet Take 25 mg by mouth daily as needed for itching.   Yes [provider]  diphenhydrAMINE-zinc acetate (BENADRYL) cream Apply 1 application topically daily as needed for itching.   Yes [provider]  estradiol (ESTRACE) 0.1 MG/GM vaginal cream Insert 1g once weekly as maintenace [id number]  Yes Copland, Alicia B, PA-C  fluticasone (FLONASE) 50 MCG/ACT nasal spray  Place 1 spray into both nostrils daily as needed for allergies or rhinitis.   Yes [provider]  gabapentin (NEURONTIN) 300 MG capsule Take 300 mg by mouth 3 (three) times daily. 05/27/21  Yes [provider]  lidocaine (XYLOCAINE) 2 % solution 5 mLs 4 (four) times daily as needed. 01/27/21  Yes [provider]  Multiple Vitamin (MULTI-VITAMINS) TABS Take 1 tablet by mouth daily.   Yes [provider]  nystatin-triamcinolone (MYCOLOG II) cream APP TO CORNERS OF MOUTH QID 03/07/19  Yes [provider]  TALTZ 80 MG/ML SOAJ Inject 80 mg as directed every 28 (twenty-eight) days. 10/24/18  Yes [provider]  Vitamin D, Ergocalciferol, (DRISDOL) 1.25 MG (50000 UNIT) CAPS capsule Take 50,000 Units by mouth once a week. 12/05/20  Yes [provider]    Family History Family History  Problem Relation Age of Onset   Diabetes Father    Hypertension Father    Hyperlipidemia Father    Breast cancer Sister 20   Melanoma Sister    Breast cancer Paternal Aunt 72    Social History Social History   Tobacco Use   Smoking status: Never   Smokeless tobacco: Never  Vaping Use   Vaping Use: Never used  Substance Use Topics   Alcohol use: Yes    Comment: rarely   Drug use: No     Allergies   Amoxil [amoxicillin], Ivp dye [iodinated contrast media], and Percocet [oxycodone-acetaminophen]   Review of Systems Review of Systems  Constitutional:  Negative for chills, fatigue and fever.  Gastrointestinal:  Negative for abdominal pain, diarrhea, nausea and vomiting.  Genitourinary:  Positive for dysuria, frequency and hematuria. Negative for decreased urine volume, flank pain, pelvic pain, urgency, vaginal bleeding, vaginal discharge and vaginal pain.  Musculoskeletal:  Negative for back pain.  Skin:  Negative for rash.     Physical Exam Triage Vital Signs ED Triage Vitals  Enc Vitals Group     BP 07/20/22 1007 129/80     Pulse Rate  07/20/22 1007 98     Resp 07/20/22 1007 16     Temp 07/20/22 1007 98.1 F (36.7 C)     Temp Source 07/20/22 1007 Oral     SpO2 07/20/22 1007 99 %     Weight 07/20/22 1006 143 lb (64.9 kg)     Height 07/20/22 1006 5' 8"$  (1.727 m)     Head Circumference --      Peak Flow --      Pain Score 07/20/22 1006 0     Pain  Loc --      Pain Edu? --      Excl. in [place]? --    No data found.  Updated Vital Signs BP 129/80 (BP Location: Left Arm)   Pulse 98   Temp 98.1 F (36.7 C) (Oral)   Resp 16   Ht 5' 8"$  (1.727 m)   Wt 143 lb (64.9 kg)   SpO2 99%   BMI 21.74 kg/m   Physical Exam Vitals and nursing note reviewed.  Constitutional:      General: She is not in acute distress.    Appearance: Normal appearance. She is not ill-appearing or toxic-appearing.  HENT:     Head: Normocephalic and atraumatic.  Eyes:     General: No scleral icterus.       Right eye: No discharge.        Left eye: No discharge.     Conjunctiva/sclera: Conjunctivae normal.  Cardiovascular:     Rate and Rhythm: Normal rate and regular rhythm.     Heart sounds: Normal heart sounds.  Pulmonary:     Effort: Pulmonary effort is normal. No respiratory distress.     Breath sounds: Normal breath sounds.  Abdominal:     Palpations: Abdomen is soft.     Tenderness: There is abdominal tenderness (suprapubic). There is no right CVA tenderness or left CVA tenderness.  Musculoskeletal:     Cervical back: Neck supple.  Skin:    General: Skin is dry.  Neurological:     General: No focal deficit present.     Mental Status: She is alert. Mental status is at baseline.     Motor: No weakness.     Gait: Gait normal.  Psychiatric:        Mood and Affect: Mood normal.        Behavior: Behavior normal.        Thought Content: Thought content normal.      UC Treatments / Results  Labs (all labs ordered are listed, but only abnormal results are displayed) Labs Reviewed  URINALYSIS, W/ REFLEX TO CULTURE (INFECTION  SUSPECTED) - Abnormal; Notable for the following components:      Result Value   Hgb urine dipstick TRACE (*)    Bacteria, UA FEW (*)    All other components within normal limits    EKG   Radiology No results found.  Procedures Procedures (including critical care time)  Medications Ordered in UC Medications - No data to display  Initial Impression / Assessment and Plan / UC Course  I have reviewed the triage vital signs and the nursing notes.  Pertinent labs & imaging results that were available during my care of the patient were reviewed by me and considered in my medical decision making (see chart for details).   67 year old female presents for urinary frequency and hematuria intermittently for the past couple weeks.  Passed a kidney stone 2 weeks ago.  Reports an episode of dysuria yesterday.  Concern for possible UTI.  She has follow-up with a urologist.  Her vitals normal and stable and she is overall well-appearing.  She has mild suprapubic tenderness to palpation but no CVA tenderness.  Urinalysis with microscopic hematuria but no evidence of UTI.  Discussed results with patient.  Advised her to follow-up with her urologist.  She reports that she thinks they can get her in in the next few days and they often performed the CTs in house.  She states she has had to have procedures done  in the past to help her pass a stone.  She is concerned about that again.  ED precautions given.   Final Clinical Impressions(s) / UC Diagnoses   Final diagnoses:  Gross hematuria     Discharge Instructions      -No sign of urinary infection based on today's urinalysis. - Reach out to your urologist for an appointment so they may perform a CT scan to assess this further.  You may have a kidney stone that was not able to be seen on the x-ray.     ED Prescriptions   None    PDMP not reviewed this encounter.   Danton Clap, PA-C 07/20/22 1135

## 2022-07-20 NOTE — Discharge Instructions (Signed)
-  No sign of urinary infection based on today's urinalysis. - Reach out to your urologist for an appointment so they may perform a CT scan to assess this further.  You may have a kidney stone that was not able to be seen on the x-ray.

## 2023-01-06 IMAGING — CT CT NECK W/ CM
4 of 5 series · 16 of 33 positions shown, 18 images · IV contrast (iopamidol)
Comparison: CT neck without contrast 02/12/2021

CLINICAL DATA: Vocal cord paralysis. Hoarseness and dysphagia.
History of oral singles.

EXAM:
CT NECK WITH CONTRAST
TECHNIQUE: Multidetector CT imaging of the neck was performed using the
standard protocol following the bolus administration of intravenous
contrast.
CONTRAST:  100mL GJON4K-I00 IOPAMIDOL (GJON4K-I00) INJECTION 61%
The patient was pre-medicated with 13 hour prep for contrast
allergy.

[Series 5: neck 2.0 st · axial · 0.45mm/px · z∈[+1050,+1198]mm · 4 of 124 slices shown]
[im 25/124  bone]
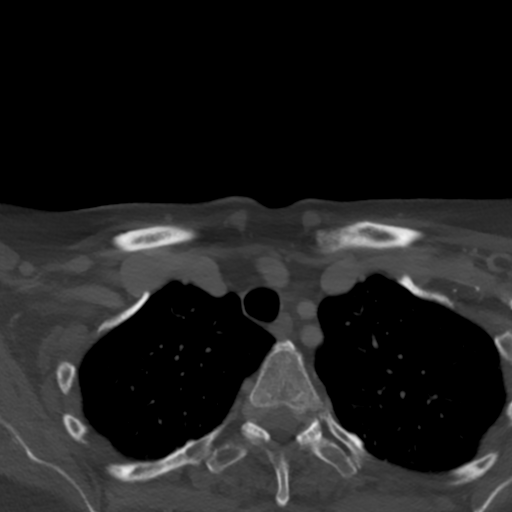
[im 50/124  bone]
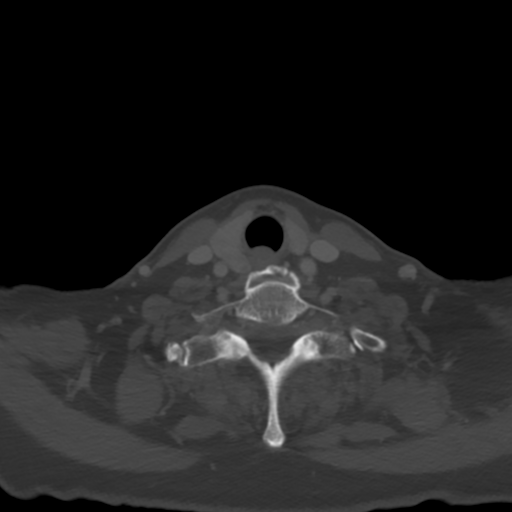
[im 74/124  bone]
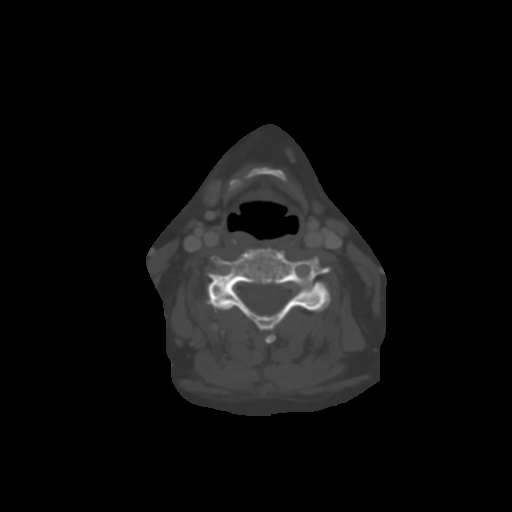
[im 99/124  bone]
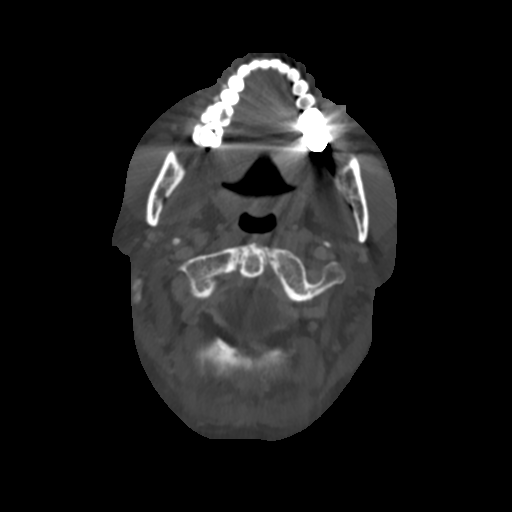

[Series 12: sag · sagittal · 0.48mm/px · 5 of 102 slices shown, 6 images]
[im 34/102  bone]
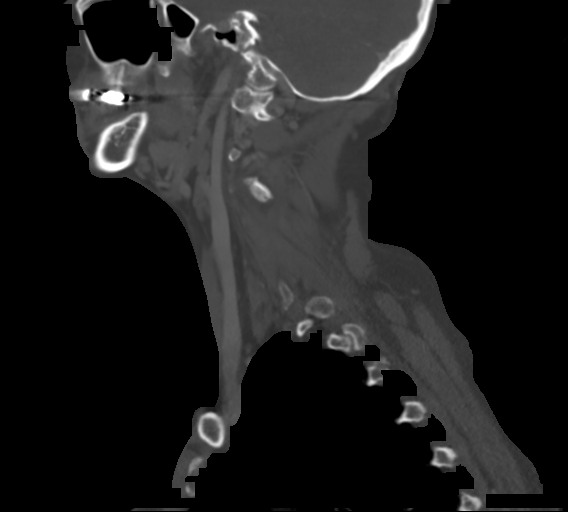
[im 43/102  bone]
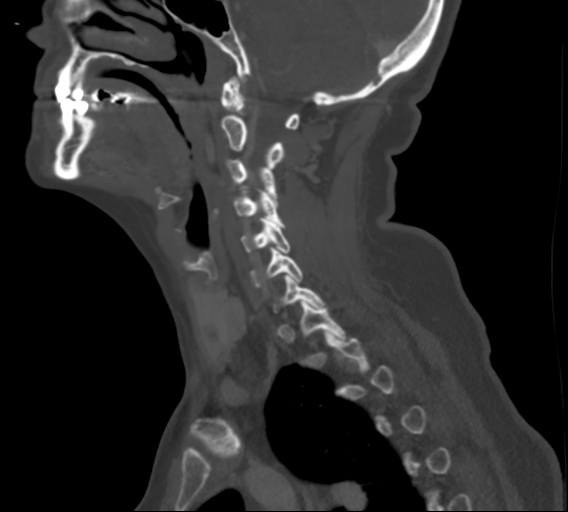
[im 51/102  soft-tissue]
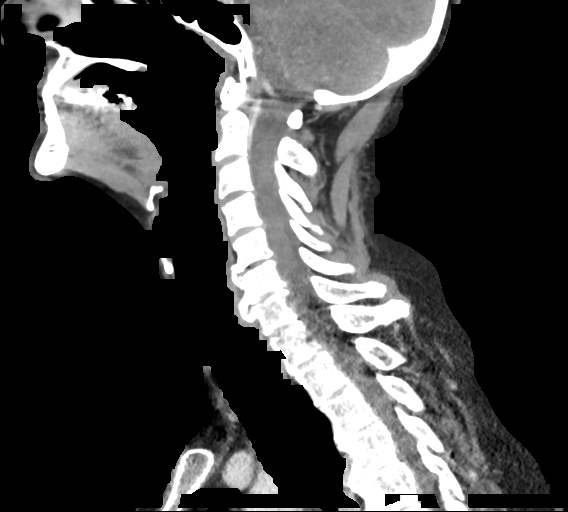
[im 51/102  bone]
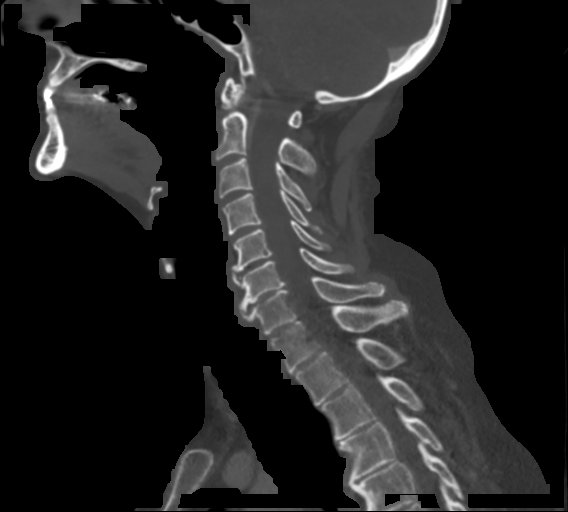
[im 59/102  bone]
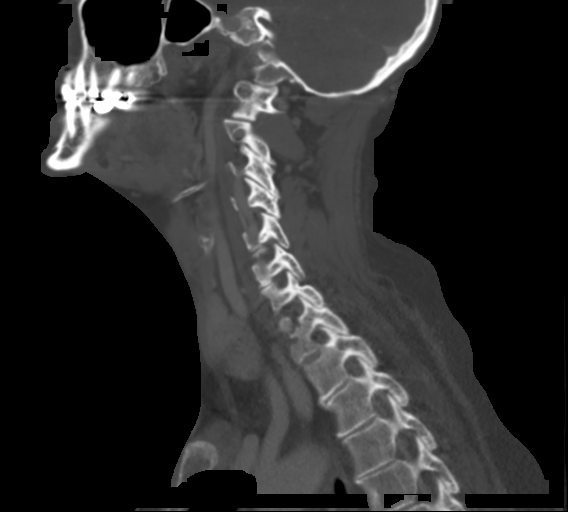
[im 68/102  bone]
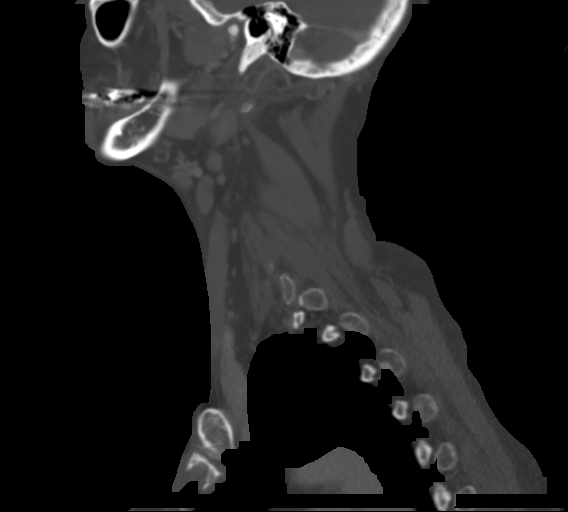

[Series 13: cor · coronal · 0.48mm/px · 3 of 107 slices shown]
[im 22/107  bone]
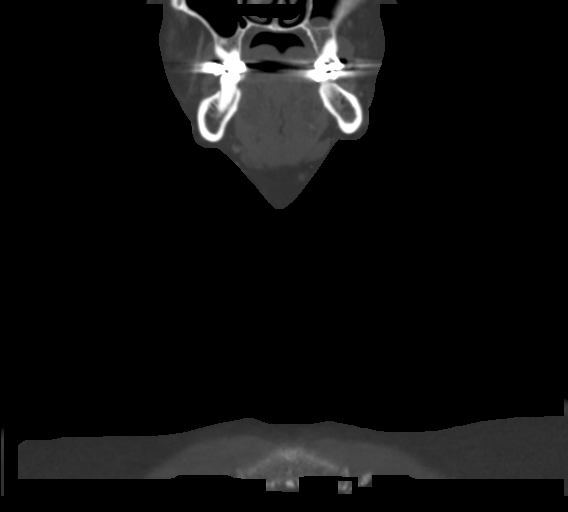
[im 43/107  bone]
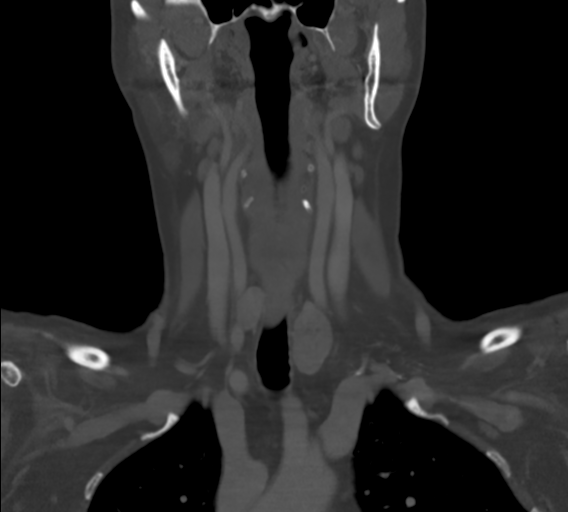
[im 64/107  bone]
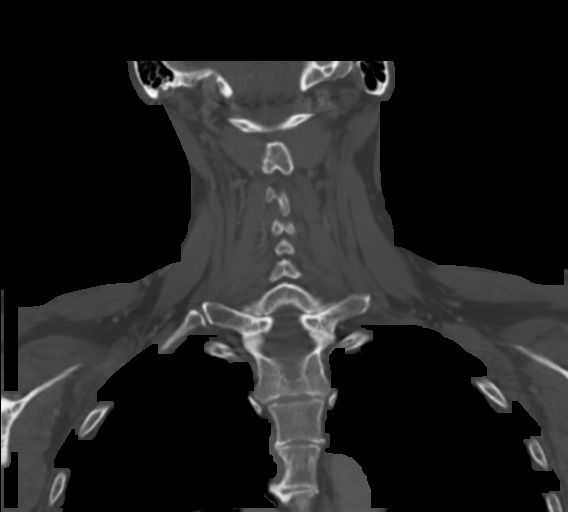

[Series 14: orthogonal axial · axial · 0.39mm/px · z∈[+1035,+1182]mm · 4 of 127 slices shown, 5 images]
[im 26/127  soft-tissue]
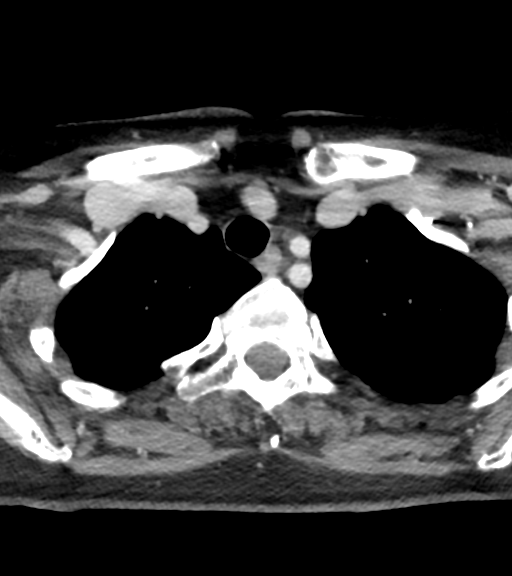
[im 26/127  bone]
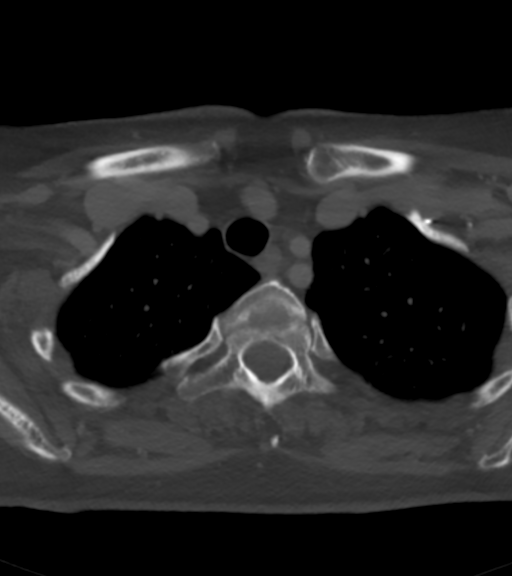
[im 51/127  bone]
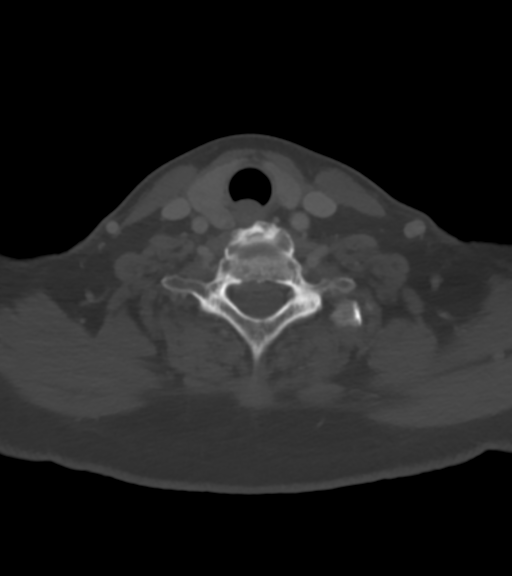
[im 76/127  bone]
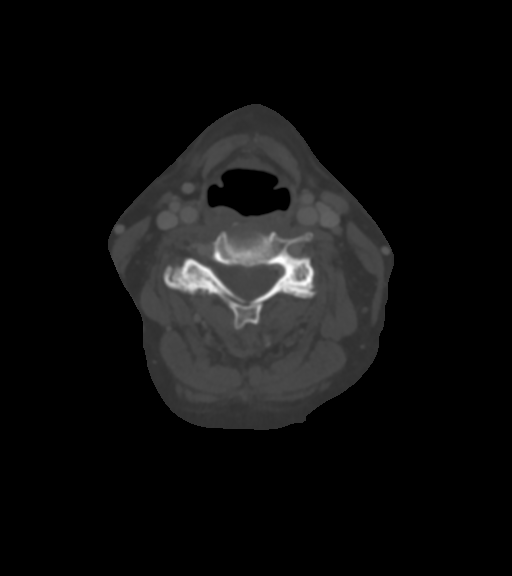
[im 101/127  bone]
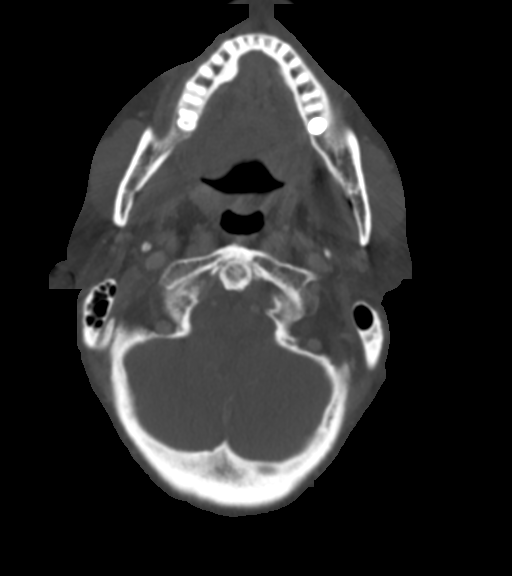

[16 of 33 positions shown; findings below may reference images not displayed]

FINDINGS: Pharynx and larynx: Normal. No mass or swelling. Vocal cords are
symmetric in position and size. No mass lesion.

Salivary glands: No inflammation, mass, or stone.

Thyroid: Multiple thyroid nodules bilaterally better seen on today's
study due to intravenous contrast. Large nodule on the left measures
2.7 cm.

Lymph nodes: No pathologic lymph nodes. Scattered subcentimeter
lymph nodes on the left.

Vascular: Normal vascular enhancement.

Limited intracranial: Negative

Visualized orbits: Not imaged

Mastoids and visualized paranasal sinuses: Negative

Skeleton: Mild degenerative changes cervical spine. No acute
skeletal abnormality.

Upper chest: Lung apices clear bilaterally.

Other: None
IMPRESSION: Normal vocal cords.  No mass or focal or asymmetry identified

2.7 cm nodule left thyroid. Recommend thyroid ultrasound. (Ref: [HOSPITAL]. [DATE]): 143-50).

## 2023-01-06 IMAGING — CT CT HEAD WO/W CM
1 of 2 series · 13 of 30 positions shown, 17 images · IV contrast (iopamidol)
Comparison: None.

CLINICAL DATA: Vocal cord paralysis.

EXAM:
CT HEAD WITHOUT AND WITH CONTRAST
TECHNIQUE: Contiguous axial images were obtained from the base of the skull
through the vertex without and with intravenous contrast
CONTRAST:  100mL [id number] IOPAMIDOL ([id number]) INJECTION 61%
Patient premedicated with 13 hour prep for contrast allergy.

[Series 3: head without · axial · non-contrast · 0.46mm/px · z∈[+1202,+1346]mm · 13 of 35 slices shown, 17 images]
[im 3/35  brain]
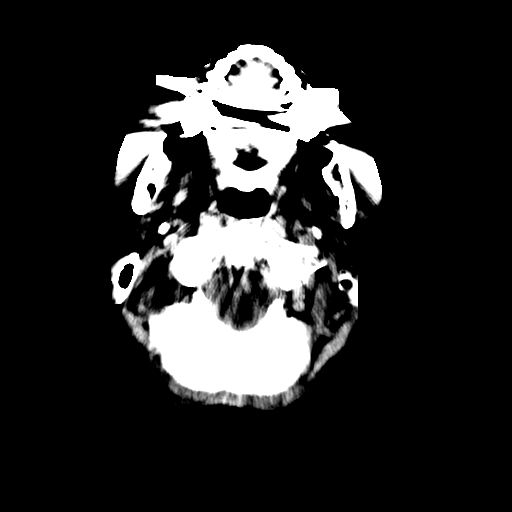
[im 3/35  bone]
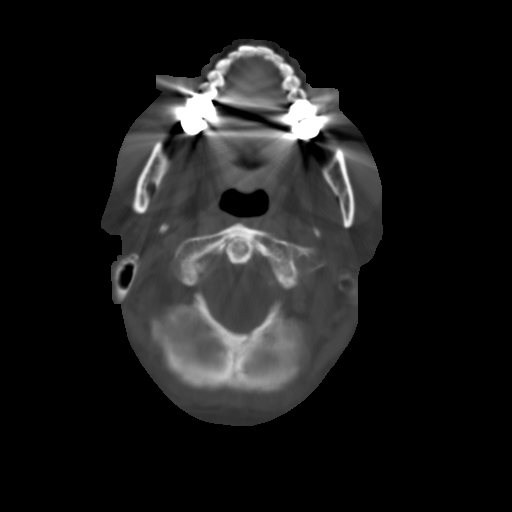
[im 5/35  brain]
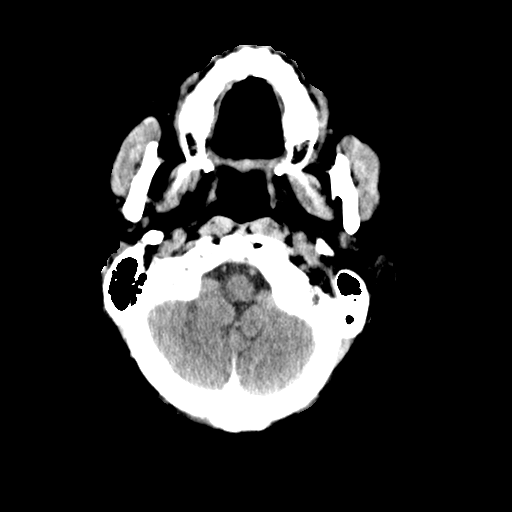
[im 8/35  brain]
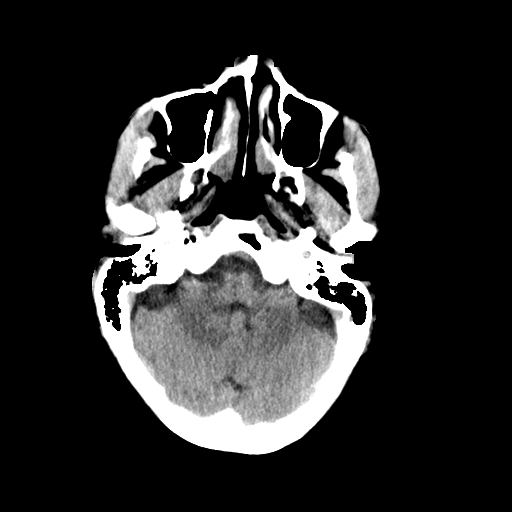
[im 10/35  brain]
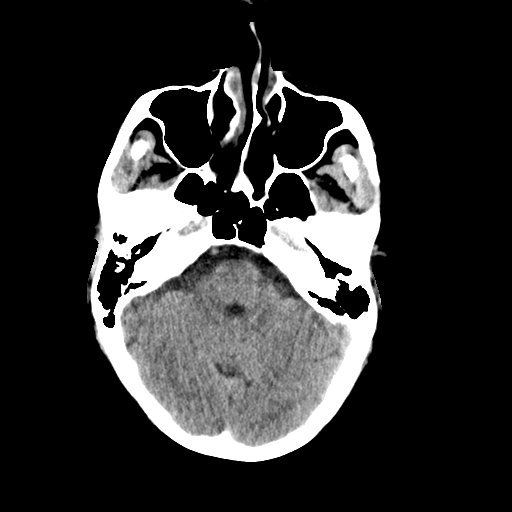
[im 13/35  brain]
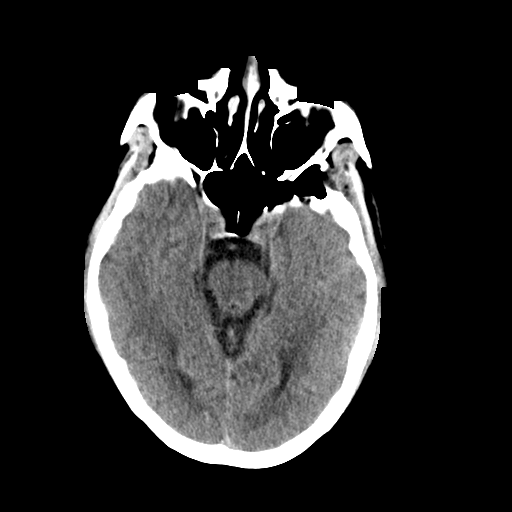
[im 13/35  bone]
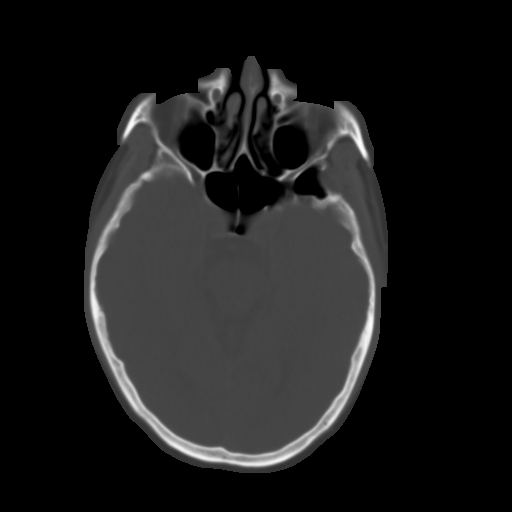
[im 15/35  brain]
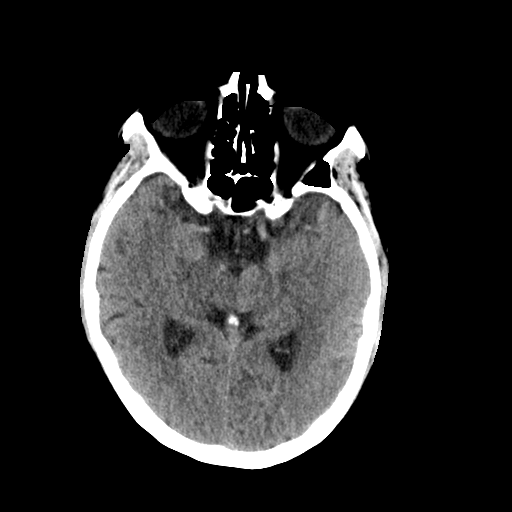
[im 18/35  brain]
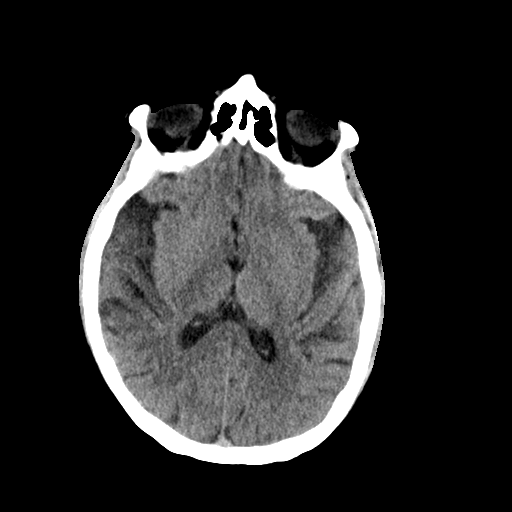
[im 20/35  brain]
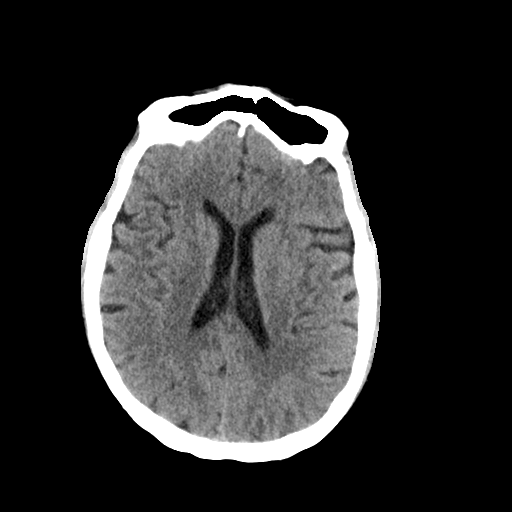
[im 22/35  brain]
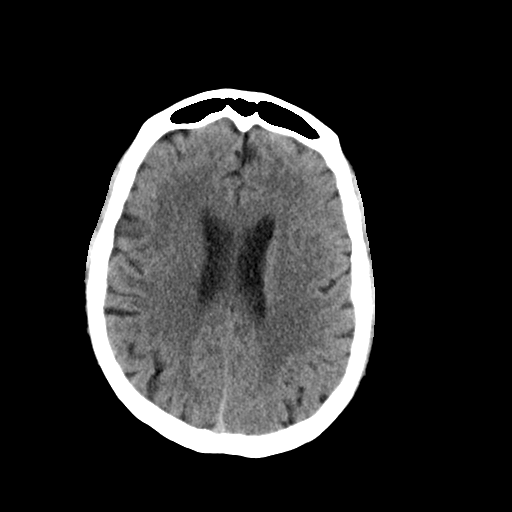
[im 22/35  bone]
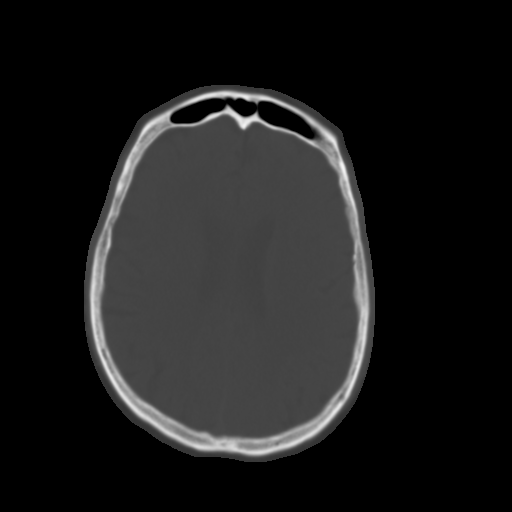
[im 25/35  brain]
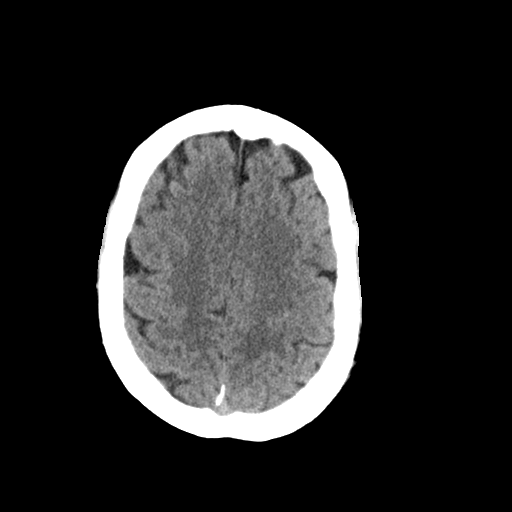
[im 27/35  brain]
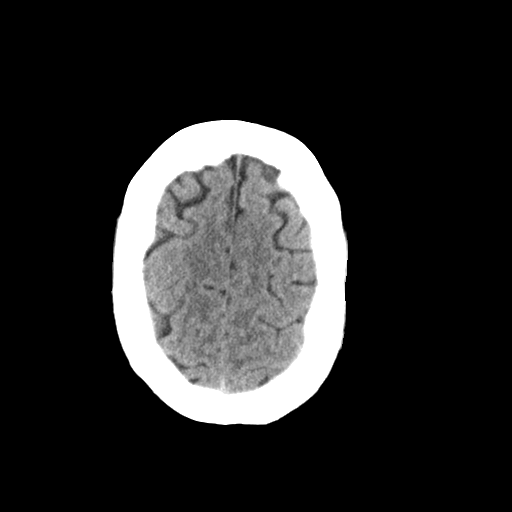
[im 30/35  brain]
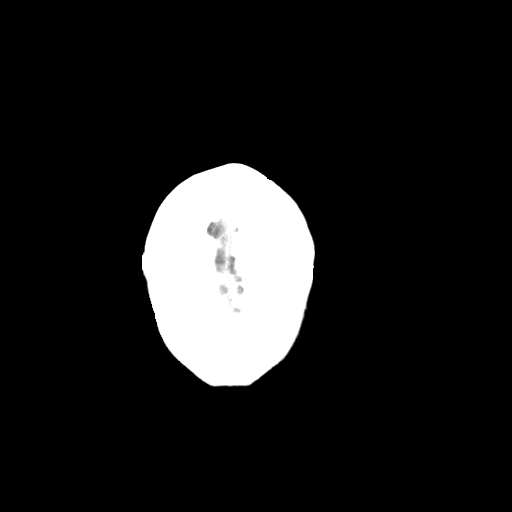
[im 32/35  brain]
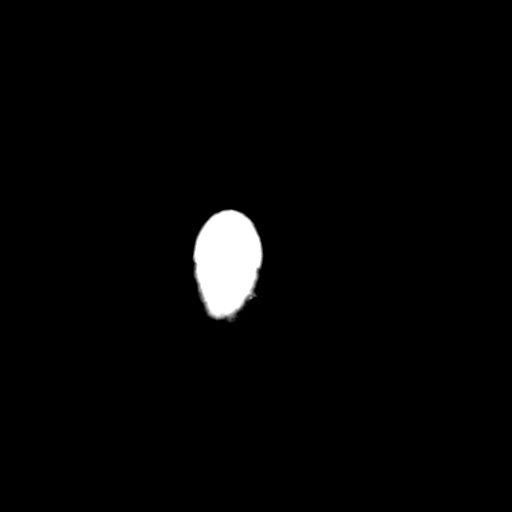
[im 32/35  bone]
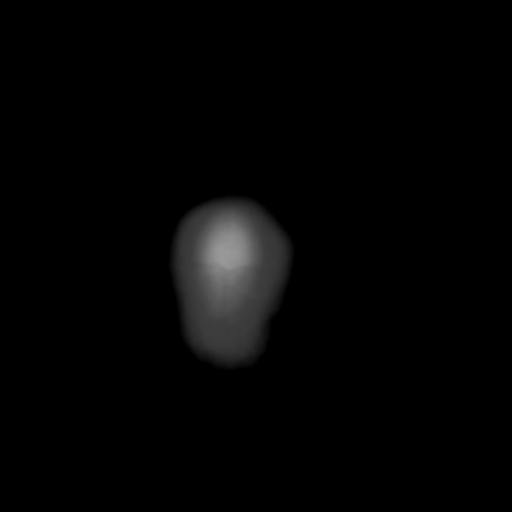

[13 of 30 positions shown; findings below may reference images not displayed]

FINDINGS: Brain: No evidence of acute infarction, hemorrhage, hydrocephalus,
extra-axial collection or mass lesion/mass effect. Normal
enhancement following contrast infusion.

Vascular: Negative for hyperdense vessel. Normal vascular
enhancement.

Skull: Negative

Sinuses/Orbits: Negative

Other: None
IMPRESSION: Negative CT head with contrast.

## 2023-01-21 IMAGING — US US THYROID
1 series · 13 of 25 positions shown · non-contrast
Comparison: CT neck March 2021

CLINICAL DATA: Incidental on CT.

EXAM:
THYROID ULTRASOUND
TECHNIQUE: Ultrasound examination of the thyroid gland and adjacent soft
tissues was performed.

[Series 1: us thyroid · 0.07mm/px · 13 of 58 slices shown]
[im 1/58]
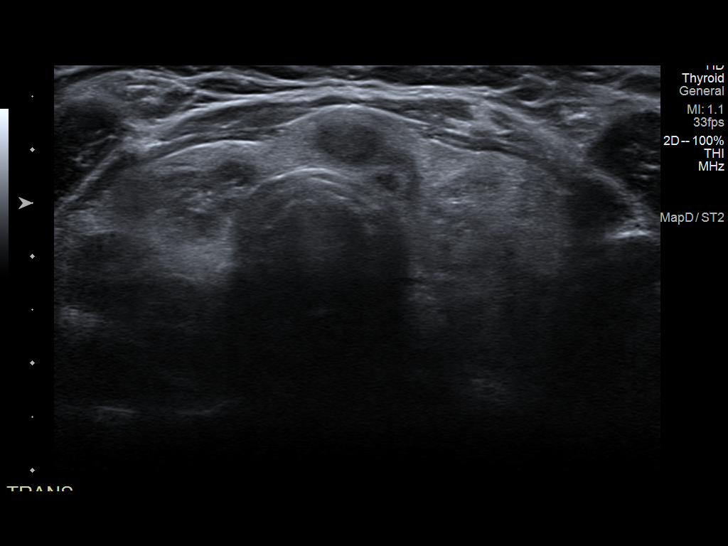
[im 5/58]
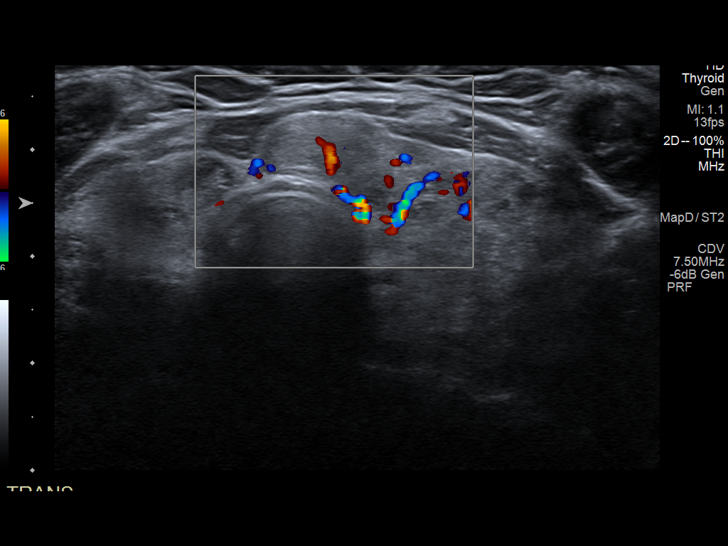
[im 10/58]
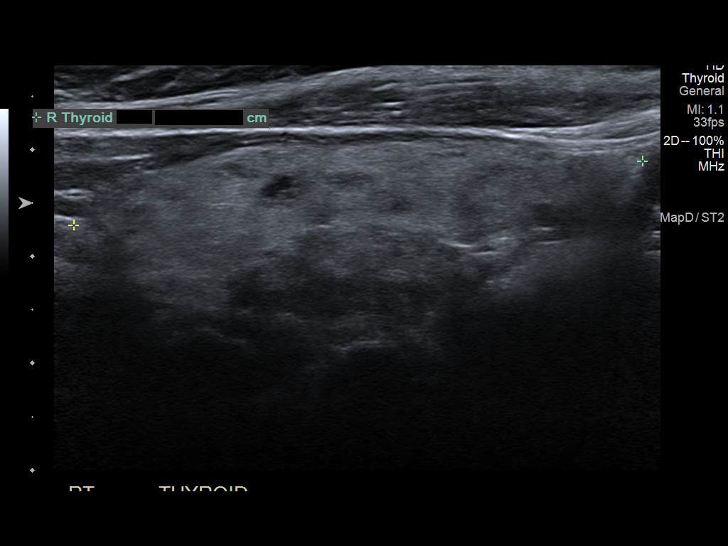
[im 15/58]
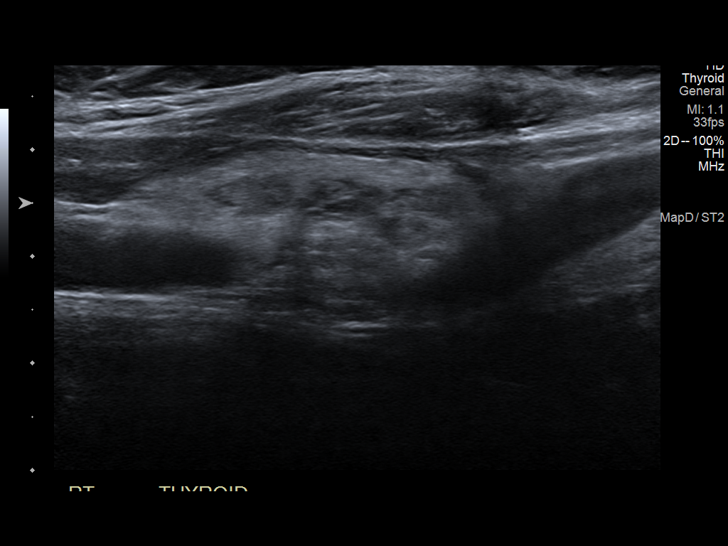
[im 20/58]
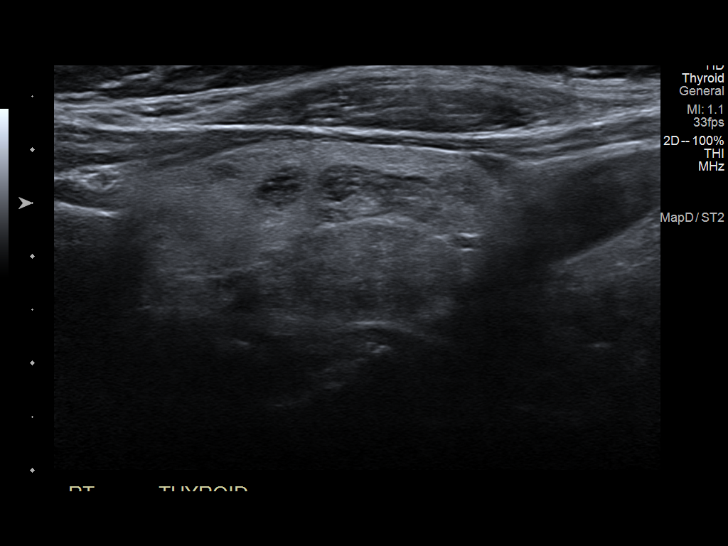
[im 24/58]
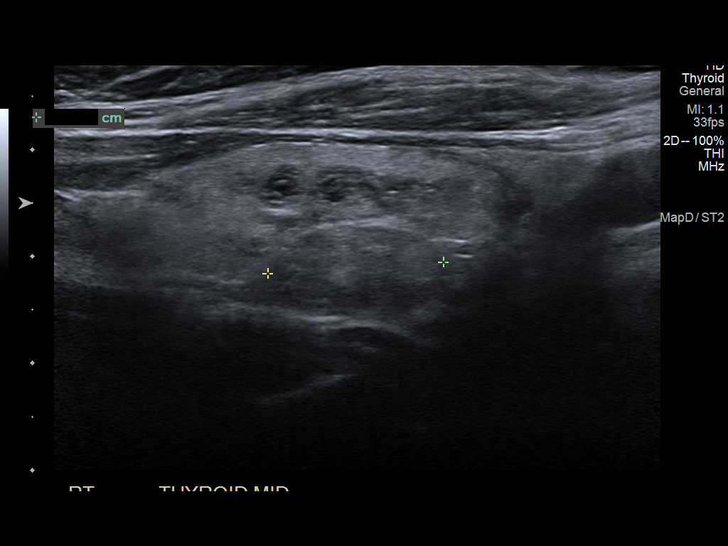
[im 29/58]
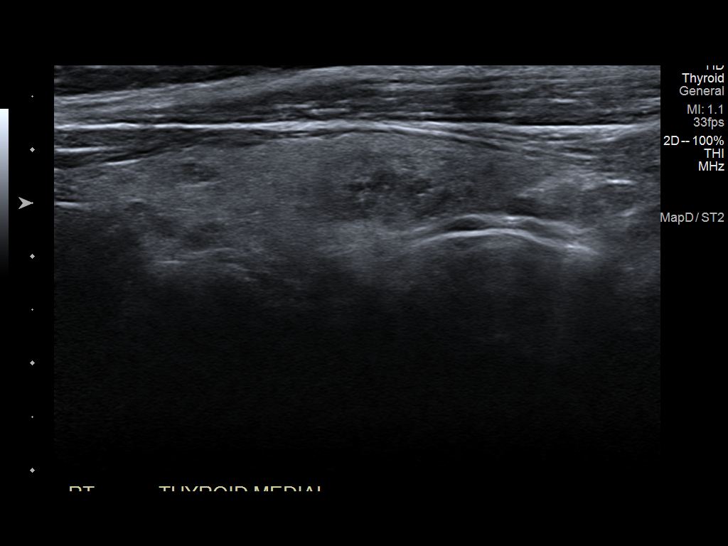
[im 34/58]
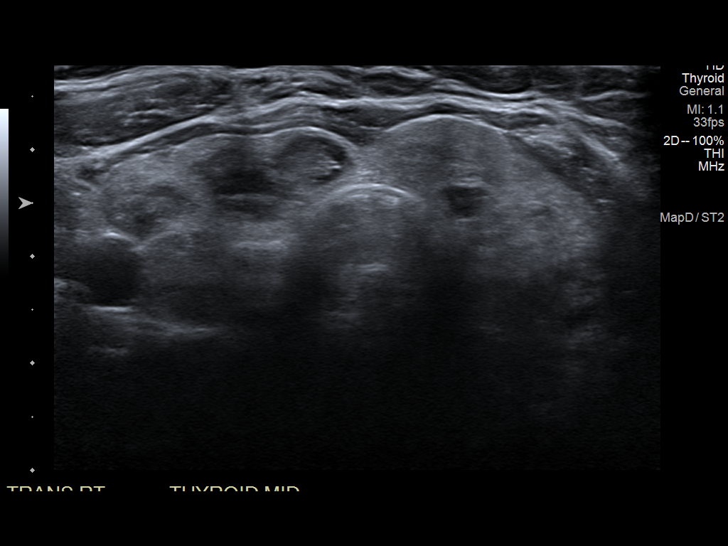
[im 39/58]
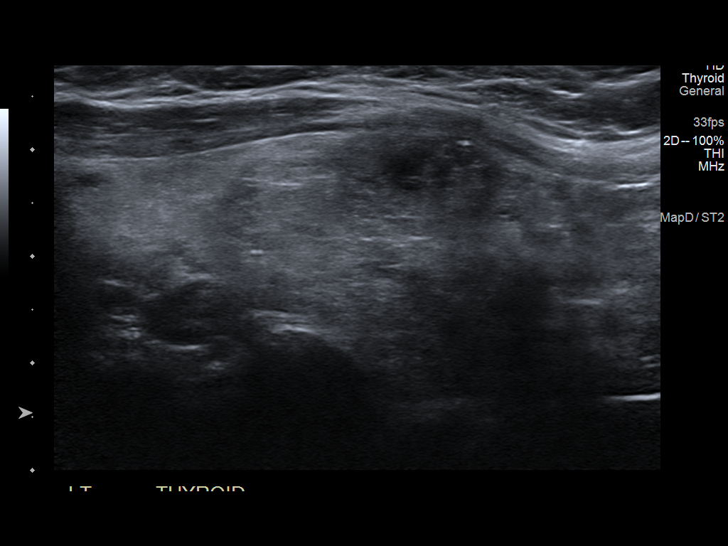
[im 43/58]
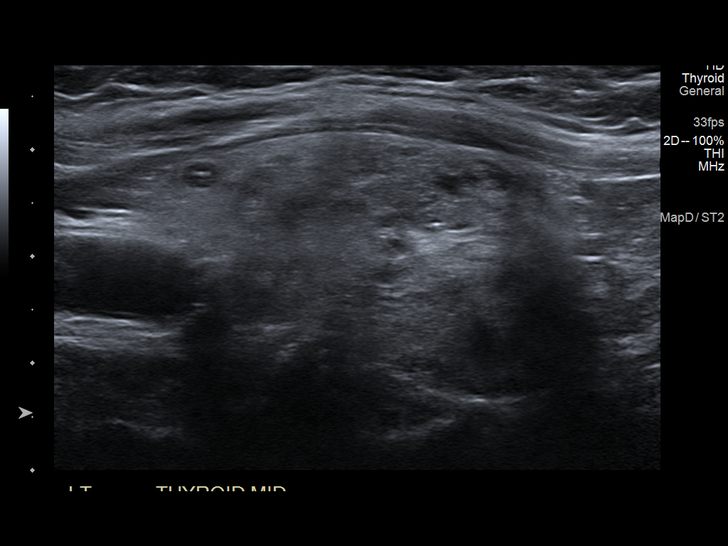
[im 48/58]
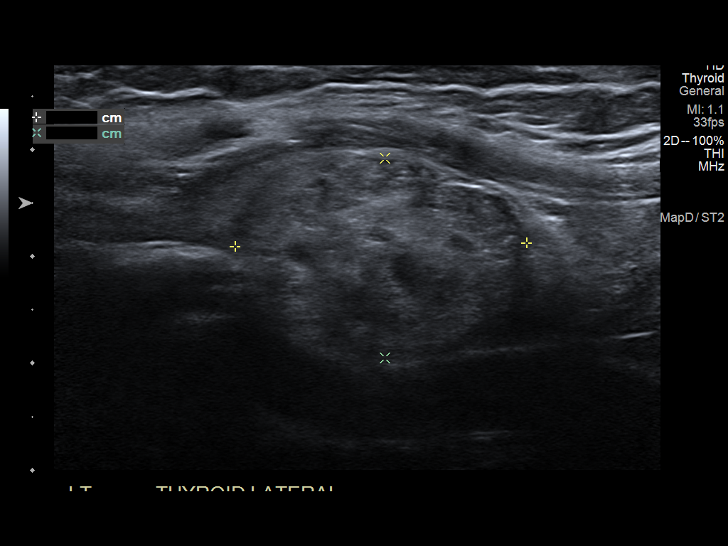
[im 53/58]
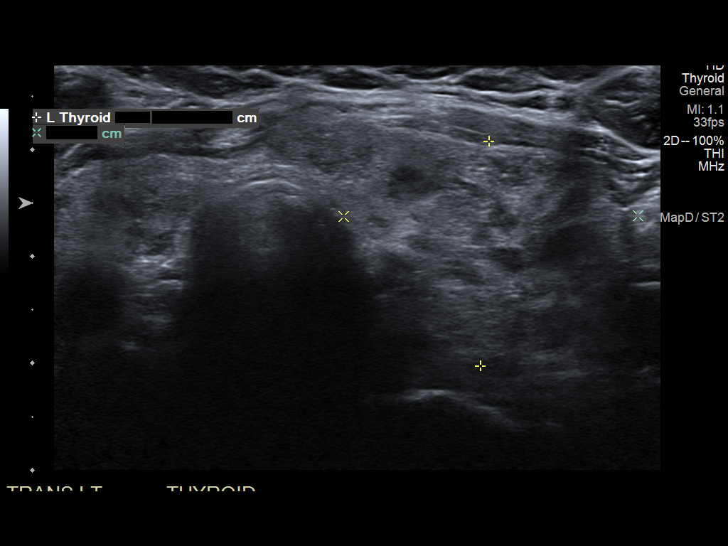
[im 58/58]
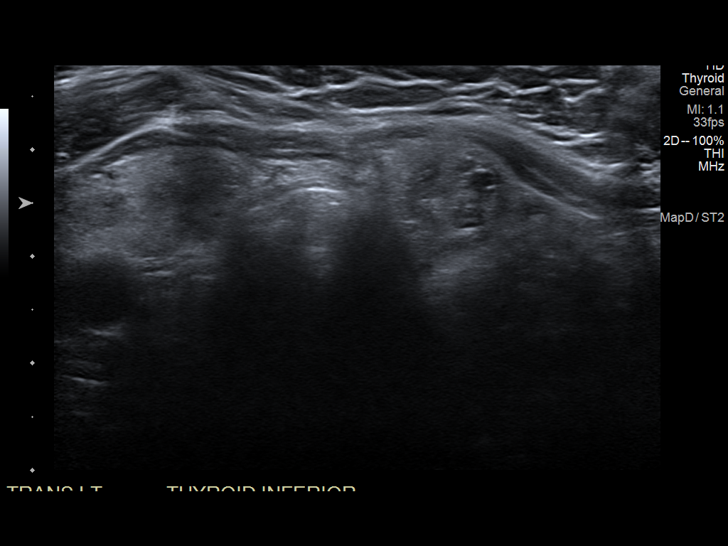

[13 of 25 positions shown; findings below may reference images not displayed]

FINDINGS: Parenchymal Echotexture: Mildly heterogenous

Isthmus: 0.6 cm

Right lobe: 5.4 x 1.9 x 1.9 cm

Left lobe: 5.6 x 2.1 x 2.8 cm

_________________________________________________________

Estimated total number of nodules >/= 1 cm:

Number of spongiform nodules >/=  2 cm not described below (TR1): 0

Number of mixed cystic and solid nodules >/= 1.5 cm not described
below (TR2): 0

_________________________________________________________

Nodule labeled 1 in the thyroid isthmus is favored to represent a
pseudo nodule.

Nodule labeled 2 is a solid hypoechoic nodule (TR 4) in the right
thyroid lobe measuring 1.4 x 0.8 x 0.7 cm. *Given size (>/= 1 -
cm) and appearance, a follow-up ultrasound in 1 year should be
considered based on TI-RADS criteria.

Nodule labeled 3 is felt to be within the thyroid gland when
compared to recent CT neck, and refers to a solid hypoechoic nodule
(TR 4) measuring 1.7 x 1.4 x 1.0 cm in the posterior aspect of the
right thyroid lobe. **Given size (>/= 1.5 cm) and appearance, fine
needle aspiration of this moderately suspicious nodule should be
considered based on TI-RADS criteria.

Nodule labeled 4 represents a small subcentimeter spongiform nodule
in the inferior aspect of the right thyroid lobe. This nodule does
NOT meet TI-RADS criteria for biopsy or dedicated follow-up.

Nodule labeled 5 is a predominantly solid hypoechoic nodule (TR 4)
in the mid left thyroid lobe measures 1.6 x 1.1 x 0.8 cm. **Given
size (>/= 1.5 cm) and appearance, fine needle aspiration of this
moderately suspicious nodule should be considered based on TI-RADS
criteria.

Nodule labeled 6 is a solid hypoechoic nodule (TR 4) in the more mid
inferior aspect of the left thyroid lobe measuring 2.8 x 2.7 x
cm. **Given size (>/= 1.5 cm) and appearance, fine needle aspiration
of this moderately suspicious nodule should be considered based on
TI-RADS criteria.
IMPRESSION: 1. Borderline enlarged multinodular thyroid gland.
2. Nodules labeled 3, 5 and 6 meet criteria for biopsy. Per ACR
TI-RADS, only the 2 most suspicious thyroid nodules should be
biopsied at a single time. Based on this recommendation, nodules
labeled 3 and 6 should be biopsied.
3. Nodule labeled 2 meets criteria for follow-up ultrasound in 1
year.

The above is in keeping with the ACR TI-RADS recommendations - [HOSPITAL] [id number];[DATE].

## 2024-04-14 ENCOUNTER — Other Ambulatory Visit: Payer: Self-pay | Admitting: Family Medicine

## 2024-04-14 DIAGNOSIS — Z1231 Encounter for screening mammogram for malignant neoplasm of breast: Secondary | ICD-10-CM
